# Patient Record
Sex: Male | Born: 1965 | Race: White | Hispanic: No | Smoking: Never smoker
Health system: Southern US, Community
[De-identification: ages and names within clinical notes are randomized; demographics above are authoritative.]

## PROBLEM LIST (undated history)

## (undated) DIAGNOSIS — F419 Anxiety disorder, unspecified: Secondary | ICD-10-CM

## (undated) DIAGNOSIS — J302 Other seasonal allergic rhinitis: Secondary | ICD-10-CM

## (undated) HISTORY — DX: Anxiety disorder, unspecified: F41.9

## (undated) HISTORY — DX: Other seasonal allergic rhinitis: J30.2

---
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
## 1999-04-14 HISTORY — PX: INGUINAL HERNIA REPAIR: SUR1180

## 2006-04-13 HISTORY — PX: SKIN CANCER EXCISION: SHX779

## 2007-06-21 ENCOUNTER — Ambulatory Visit: Payer: Self-pay | Admitting: Family Medicine

## 2007-06-21 DIAGNOSIS — F341 Dysthymic disorder: Secondary | ICD-10-CM | POA: Insufficient documentation

## 2007-06-21 LAB — CONVERTED CEMR LAB
AST: 30 units/L (ref 0–37)
Albumin: 4 g/dL (ref 3.5–5.2)
Basophils Absolute: 0 10*3/uL (ref 0.0–0.1)
Bilirubin, Direct: 0.1 mg/dL (ref 0.0–0.3)
Chloride: 106 meq/L (ref 96–112)
Direct LDL: 143 mg/dL
Eosinophils Absolute: 0.1 10*3/uL (ref 0.0–0.6)
Eosinophils Relative: 1.3 % (ref 0.0–5.0)
GFR calc Af Amer: 71 mL/min
GFR calc non Af Amer: 59 mL/min
Glucose, Bld: 110 mg/dL — ABNORMAL HIGH (ref 70–99)
HCT: 40.1 % (ref 39.0–52.0)
Hemoglobin: 13.6 g/dL (ref 13.0–17.0)
Lymphocytes Relative: 37.9 % (ref 12.0–46.0)
MCHC: 34 g/dL (ref 30.0–36.0)
MCV: 88.6 fL (ref 78.0–100.0)
Monocytes Absolute: 0.4 10*3/uL (ref 0.2–0.7)
Neutro Abs: 2.2 10*3/uL (ref 1.4–7.7)
Neutrophils Relative %: 51.1 % (ref 43.0–77.0)
Potassium: 4.9 meq/L (ref 3.5–5.1)
Sodium: 145 meq/L (ref 135–145)
WBC: 4.4 10*3/uL — ABNORMAL LOW (ref 4.5–10.5)

## 2007-06-24 ENCOUNTER — Telehealth: Payer: Self-pay | Admitting: Family Medicine

## 2007-06-27 ENCOUNTER — Telehealth: Payer: Self-pay | Admitting: Family Medicine

## 2007-07-05 ENCOUNTER — Telehealth: Payer: Self-pay | Admitting: Family Medicine

## 2007-07-07 ENCOUNTER — Telehealth (INDEPENDENT_AMBULATORY_CARE_PROVIDER_SITE_OTHER): Payer: Self-pay | Admitting: *Deleted

## 2008-09-03 ENCOUNTER — Encounter: Payer: Self-pay | Admitting: Family Medicine

## 2008-09-04 ENCOUNTER — Ambulatory Visit: Payer: Self-pay | Admitting: Family Medicine

## 2008-09-06 LAB — CONVERTED CEMR LAB
AST: 25 units/L (ref 0–37)
Albumin: 4.3 g/dL (ref 3.5–5.2)
Alkaline Phosphatase: 36 units/L — ABNORMAL LOW (ref 39–117)
BUN: 14 mg/dL (ref 6–23)
Basophils Absolute: 0 10*3/uL (ref 0.0–0.1)
CO2: 33 meq/L — ABNORMAL HIGH (ref 19–32)
Calcium: 9.7 mg/dL (ref 8.4–10.5)
Cholesterol: 198 mg/dL (ref 0–200)
Glucose, Bld: 95 mg/dL (ref 70–99)
HDL: 59.6 mg/dL (ref >39.00)
Hemoglobin: 14 g/dL (ref 13.0–17.0)
Lymphocytes Relative: 29.7 % (ref 12.0–46.0)
Monocytes Relative: 6.9 % (ref 3.0–12.0)
Platelets: 165 10*3/uL (ref 150.0–400.0)
RDW: 11.9 % (ref 11.5–14.6)
Sodium: 145 meq/L (ref 135–145)
TSH: 1.36 microintl units/mL (ref 0.35–5.50)
Total Protein: 7.2 g/dL (ref 6.0–8.3)
WBC: 5 10*3/uL (ref 4.5–10.5)

## 2013-11-13 ENCOUNTER — Encounter: Payer: Self-pay | Admitting: Neurology

## 2013-12-04 ENCOUNTER — Telehealth: Payer: Self-pay | Admitting: Neurology

## 2013-12-04 NOTE — Telephone Encounter (Signed)
Pt called to cancel his 12/07/13 appt. He did not give a reason. Dr. Valentina Lucks office was notified by phone.

## 2013-12-07 ENCOUNTER — Telehealth: Payer: Self-pay | Admitting: Neurology

## 2013-12-07 ENCOUNTER — Ambulatory Visit: Payer: Self-pay | Admitting: Neurology

## 2013-12-07 NOTE — Telephone Encounter (Signed)
Let Dr Valentina Lucks office know that the patient cancelled appt and did not resch

## 2014-04-10 ENCOUNTER — Ambulatory Visit: Payer: Self-pay

## 2018-02-02 DIAGNOSIS — F0390 Unspecified dementia without behavioral disturbance: Secondary | ICD-10-CM | POA: Insufficient documentation

## 2018-02-02 DIAGNOSIS — F0393 Unspecified dementia, unspecified severity, with mood disturbance: Secondary | ICD-10-CM | POA: Insufficient documentation

## 2018-02-02 DIAGNOSIS — F329 Major depressive disorder, single episode, unspecified: Principal | ICD-10-CM

## 2018-02-11 ENCOUNTER — Ambulatory Visit: Payer: BC Managed Care – PPO | Admitting: Psychiatry

## 2018-02-11 DIAGNOSIS — F321 Major depressive disorder, single episode, moderate: Secondary | ICD-10-CM

## 2018-02-11 NOTE — Progress Notes (Signed)
      [facility] Counselor/Therapist Progress Note   Patient ID: Collin Harris, MRN: [id number]  Date: 02/11/2018  Timespent: 58 minutes   Treatment Type: Individual   Reported Symptoms: anxiety   Mental Status Exam:    Appearance:   Well Groomed     Behavior:  Appropriate  Motor:  Normal  Speech/Language:   Clear and Coherent  Affect:  Appropriate  Mood:  anxious  Thought process:  normal  Thought content:    WNL  Sensory/Perceptual disturbances:    WNL  Orientation:  oriented to person, place, time/date and situation  Attention:  Good  Concentration:  Good  Memory:  WNL  Fund of knowledge:   Good  Insight:    Good  Judgment:   Good  Impulse Control:  Good     Risk Assessment: Danger to Self:  No Self-injurious Behavior: No Danger to Others: No Duty to Warn:no Physical Aggression / Violence:No  Access to Firearms a concern: No  Gang Involvement:No    Subjective: The client comes in today anxious and stressed.  He recently met a woman online that he is planning to meet in person tomorrow in [place], [place].  He has anxiety about meeting her for the first time.  He also notes that he and his ex-wife are switching residences to better take care of their 2 dogs theyhave.  The client is also stressed by his mom in [place].  She is out of rehab and into assisted living but wants to return home.  She is unable to drive and he is wrestling with how to manage that with her.  He also is stressed out with his work at the [employer]  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  In February he takes the boards to be certified in obesity medicine. I started with eye movement today around the clients level of stress about these different issues.  He was able to reduce his subjective units of distress from a suds equals 7+ to less than 1 at the end of the session.  We also did an exercise with mindfulness and breath work.  He was able  to get to a very calm and peaceful place inside of himself and then we integrated the word fluid as his cue word to that.  The client felt significantly better at the end of the session.  He will use the cue word during the course of the day to bring back to the feelings of relaxation.   Interventions: Mindfulness Meditation, Solution-Oriented/Positive Psychology, [facility] Desensitization and Reprocessing (EMDR) and Insight-Oriented   Diagnosis:   ICD-10-CM   1. Major depressive disorder, single episode, moderate (HCC) F32.1      Plan: Use mindfulness, relaxation skills, positive self talk.   Albertina Parr [place], [place]

## 2018-03-25 ENCOUNTER — Encounter: Payer: Self-pay | Admitting: Psychiatry

## 2018-03-25 ENCOUNTER — Ambulatory Visit: Payer: BC Managed Care – PPO | Admitting: Psychiatry

## 2018-03-25 DIAGNOSIS — F321 Major depressive disorder, single episode, moderate: Secondary | ICD-10-CM | POA: Diagnosis not present

## 2018-03-25 NOTE — Progress Notes (Signed)
      [facility] Counselor/Therapist Progress Note  Patient ID: Collin Harris, MRN: [id number],    Date: 03/25/2018  Time Spent: 57 minutes   Treatment Type: Individual Therapy  Reported Symptoms: Anxious Mood  Mental Status Exam:  Appearance:   Well Groomed     Behavior:  Appropriate  Motor:  Normal  Speech/Language:   Clear and Coherent  Affect:  Appropriate  Mood:  anxious and sad  Thought process:  normal  Thought content:    WNL  Sensory/Perceptual disturbances:    WNL  Orientation:  oriented to person, place, time/date and situation  Attention:  Good  Concentration:  Good  Memory:  WNL  Fund of knowledge:   Good  Insight:    Good  Judgment:   Good  Impulse Control:  Good   Risk Assessment: Danger to Self:  No Self-injurious Behavior: No Danger to Others: No Duty to Warn:no Physical Aggression / Violence:No  Access to Firearms a concern: No  Gang Involvement:No   Subjective: The client states that he is still dating a woman he met on [employer] who lives in [place], [place].  He states, "we communicate a lot but she seems a little damaged."  In the big picture the client is enjoying the relationship because they communicate well.  He states, "she pays attention to me and I like how we connect." We discussed that the client should not over analyze the circumstance.  He agrees he needs to walk it out with her and see how things go.  He does have some anxiety because of the uncertainty of where things are going.  I discussed with the client that it is still so early in the relationship he needs to relax.  He agrees.  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  He states it will be a jump in his salary which he needs.  He still continues to study for the boards in February on obesity medicine.  The client and his ex-wife have switched houses.  He lives in her townhome and she lives in the house.   They both continue to pay for their respective houses.  They just live in different places.  This seems to be working out well for both of them. The client continues to exercise increase his social network and pay attention to his self-care.  He continues to be cognizant that he needs to be appropriately assertive in the relationship and ask for what he wants.   Interventions: Assertiveness/Communication, Solution-Oriented/Positive Psychology, Eye Movement Desensitization and Reprocessing (EMDR) and Insight-Oriented  Diagnosis:   ICD-10-CM   1. Major depressive disorder, single episode, moderate (HCC) F32.1     Plan: Assertiveness, exercise, self-care.  Albertina Parr Leeandre Nordling, [place]

## 2018-05-13 ENCOUNTER — Encounter: Payer: Self-pay | Admitting: Psychiatry

## 2018-05-13 ENCOUNTER — Ambulatory Visit: Payer: BC Managed Care – PPO | Admitting: Psychiatry

## 2018-05-13 DIAGNOSIS — F321 Major depressive disorder, single episode, moderate: Secondary | ICD-10-CM

## 2018-05-13 NOTE — Progress Notes (Signed)
      [facility] Counselor/Therapist Progress Note  Patient ID: Collin Harris, MRN: [id number],    Date: 05/13/2018  Time Spent: 58 minutes   Treatment Type: Individual Therapy  Reported Symptoms: Anxious Mood  Mental Status Exam:  Appearance:   Casual and Well Groomed     Behavior:  Appropriate  Motor:  Normal  Speech/Language:   Clear and Coherent  Affect:  Appropriate  Mood:  anxious  Thought process:  normal  Thought content:    WNL  Sensory/Perceptual disturbances:    WNL  Orientation:  oriented to person, place, time/date and situation  Attention:  Good  Concentration:  Good  Memory:  WNL  Fund of knowledge:   Good  Insight:    Good  Judgment:   Good  Impulse Control:  Good   Risk Assessment: Danger to Self:  No Self-injurious Behavior: No Danger to Others: No Duty to Warn:no Physical Aggression / Violence:No  Access to Firearms a concern: No  Gang Involvement:No   Subjective: The client came in with 2 issues today.  1) relationship.  2) work.  The client states that they are losing to psychiatric providers at the [facility].  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  He is still excessively worried about being sued.  He is also studying for the obesity boards on February 24. The client is still in a relationship with a woman in [place].  Today I used eye movement with the client focusing on the girlfriend.  His negative belief was, "I am uncertain."  He feels chaos in his chest.  As the client processed he had a lot of uncertainty about if this relationship was really working or not.  He states they connect well sexually and when they are together the communication is good.  The fact that there are 3-1/2 hours apart is complicated.  He plans to go up there this weekend and rent an air B&B.  He will ask her how she feels the relationship is going and being direct about what he needs or want.   At the end of the session the clients anxiety had gone from a subjective units of distress of 7 to less than 2.  He will be proactive and plan on taking things 1 day at a time.  Interventions: Assertiveness/Communication, Solution-Oriented/Positive Psychology, Eye Movement Desensitization and Reprocessing (EMDR) and Insight-Oriented  Diagnosis:   ICD-10-CM   1. Major depressive disorder, single episode, moderate (HCC) F32.1     Plan: Assertiveness, boundaries, positive self talk.  Collin Harris, [place]

## 2018-07-08 ENCOUNTER — Ambulatory Visit: Payer: BC Managed Care – PPO | Admitting: Psychiatry

## 2018-08-05 ENCOUNTER — Encounter: Payer: Self-pay | Admitting: Psychiatry

## 2018-08-05 ENCOUNTER — Other Ambulatory Visit: Payer: Self-pay

## 2018-08-05 ENCOUNTER — Ambulatory Visit: Payer: BC Managed Care – PPO | Admitting: Psychiatry

## 2018-08-05 DIAGNOSIS — F321 Major depressive disorder, single episode, moderate: Secondary | ICD-10-CM | POA: Diagnosis not present

## 2018-08-05 NOTE — Progress Notes (Signed)
      [facility] Counselor/Therapist Progress Note  Patient ID: Collin Harris, MRN: [id number],    Date: 08/05/2018  Time Spent: 56 minutes   Treatment Type: Individual Therapy  Reported Symptoms: anxiety, depressed mood  Mental Status Exam:  Appearance:   Casual and Well Groomed     Behavior:  Appropriate  Motor:  Normal  Speech/Language:   Clear and Coherent  Affect:  Appropriate  Mood:  anxious, depressed and sad  Thought process:  normal  Thought content:    WNL  Sensory/Perceptual disturbances:    WNL  Orientation:  oriented to person, place, time/date and situation  Attention:  Good  Concentration:  Good  Memory:  WNL  Fund of knowledge:   Good  Insight:    Good  Judgment:   Good  Impulse Control:  Good   Risk Assessment: Danger to Self:  No Self-injurious Behavior: No Danger to Others: No Duty to Warn:no Physical Aggression / Violence:No  Access to Firearms a concern: No  Gang Involvement:No   Subjective: I connected with patient by a video enabled telemedicine application or telephone, with their informed consent, and verified patient privacy and that I am speaking with the correct person using two identifiers.  I was located at [facility] and patient at home. The client reports that he has been sheltering in place at home.  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  He states he is required to go into the office every third day.  He is unsure when things will return to a normal schedule.  There are no students on campus so things are very slow. His dogs which have been his main companions are staying at the house most of the time. He still owns the house with his ex-wife.  He is staying at her townhome for the time being.  Recently 1 of the dogs got very sick and had to go to the vet emergency room.  His relationship with his girlfriend in [place] has been more  difficult since they can only communicate by text or phone. The client overall is melancholy about his current circumstances.  He is so lonely since he has no partner here.  We discussed things that he could do to improve his mood.  He states he does get out every other day to run which helps.  When he has the dogs he does take them each for 2-hour walk.  He is concerned about the virus because he has underlying health issues.  As the client discussed these issues he became calmer and more focused about what he can do.  He agrees to focus on his sunlight exposure, exercise, engaged activities, engaging his social network, sleep hygiene, and omega-3 fatty acids.   Interventions: Assertiveness/Communication, Solution-Oriented/Positive Psychology and Insight-Oriented  Diagnosis:   ICD-10-CM   1. Major depressive disorder, single episode, moderate (HCC) F32.1     Plan: Review handout, 6 steps to beating depression.  Assertive communication, boundaries.  This record has been created using [employer].  Chart creation errors have been sought, but Keric Zehren not always have been located and corrected. Such creation errors do not reflect on the standard of medical care.  Gelene Mink Mattea Seger, [facility]

## 2018-09-16 ENCOUNTER — Other Ambulatory Visit: Payer: Self-pay

## 2018-09-16 ENCOUNTER — Encounter: Payer: Self-pay | Admitting: Psychiatry

## 2018-09-16 ENCOUNTER — Ambulatory Visit: Payer: BC Managed Care – PPO | Admitting: Psychiatry

## 2018-09-16 DIAGNOSIS — F321 Major depressive disorder, single episode, moderate: Secondary | ICD-10-CM

## 2018-09-16 NOTE — Progress Notes (Signed)
[facility] Counselor/Therapist Progress Note  Patient ID: Collin Harris, MRN: [id number],    Date: 09/16/2018  Time Spent: 55 minutes   Treatment Type: Individual Therapy  Reported Symptoms: melancholy, sad  Mental Status Exam:  Appearance:   Casual and Well Groomed     Behavior:  Appropriate  Motor:  Normal  Speech/Language:   Clear and Coherent  Affect:  Appropriate  Mood:  depressed and sad  Thought process:  normal  Thought content:    WNL  Sensory/Perceptual disturbances:    WNL  Orientation:  oriented to person, place, time/date and situation  Attention:  Good  Concentration:  Good  Memory:  WNL  Fund of knowledge:   Good  Insight:    Good  Judgment:   Good  Impulse Control:  Good   Risk Assessment: Danger to Self:  No Self-injurious Behavior: No Danger to Others: No Duty to Warn:no Physical Aggression / Violence:No  Access to Firearms a concern: No  Gang Involvement:No   Subjective: Telehealth visit (video) I connected with patient by a video enabled telemedicine/telehealth application or telephone, with his informed consent, and verified patient privacy and that I am speaking with the correct person using two identifiers.  I was located at my office and patient at his home.  We discussed the limitations, risks, and security and privacy concerns associated with telehealth services and the availability of in-person appointments, including awareness that he Collin Harris be responsible for charges related to the service, and he expressed understanding and agreed to proceed.  I discussed treatment planning with him, with opportunity to ask and answer all questions. Agreed with the plan, demonstrated an understanding of the instructions, and made him aware to call our office if symptoms worsen or he feels he is in a crisis state and needs immediate contact.  The client is having a difficult time with the ongoing shelter in place and curfew that has occurred.  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  This is not his first choice.  He would like to be able to get out of direct patient care altogether.  He finds it too stressful and fears the liability.  He recently passed his boards certification for obesity medicine.  Nationally he scored 96%. Any options that he has to do obesity medicine are currently on hold due to the uncertainty with medical system.  Client has also not been able to see his girlfriend since March 6.  She lives in [place] and they have not been able to travel back and forth.  "I just do not know what to do."  The client has been studying Buddhism more diligently.  He is working towards practicing mindfulness at least 20 to 30 minutes/day.  We discussed different mindfulness practices such as controlled breathing and being mindful while running or eating.  The client has been practicing these which has helped. The client continues to work on radical acceptance, practicing mood independent behavior and being hopeful that things will calm down not only locally but nationally. In mid July the client plans on driving to [place] to visit his mother.  He is unsure of where things will be with the quarantine that is been happening in the nursing facilities.  Interventions: Assertiveness/Communication, Mindfulness Meditation, Motivational Interviewing, Solution-Oriented/Positive Psychology and Insight-Oriented  Diagnosis:   ICD-10-CM   1. Major depressive disorder, single episode, moderate (HCC) F32.1  Plan: Mindfulness, mood independent behavior, exercise, sun exposure, assertiveness.  This record has been created using [employer] software.  Chart creation errors have been sought, but Collin Harris not always have been located and corrected. Such creation errors do not reflect on the standard of medical care.  Collin Harris, [facility]

## 2018-10-21 ENCOUNTER — Other Ambulatory Visit: Payer: Self-pay

## 2018-10-21 ENCOUNTER — Encounter: Payer: Self-pay | Admitting: Psychiatry

## 2018-10-21 ENCOUNTER — Ambulatory Visit: Payer: BC Managed Care – PPO | Admitting: Psychiatry

## 2018-10-21 DIAGNOSIS — F321 Major depressive disorder, single episode, moderate: Secondary | ICD-10-CM
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
## 2019-02-17 ENCOUNTER — Encounter: Payer: Self-pay | Admitting: Psychiatry

## 2019-02-17 ENCOUNTER — Ambulatory Visit (INDEPENDENT_AMBULATORY_CARE_PROVIDER_SITE_OTHER): Payer: BC Managed Care – PPO | Admitting: Psychiatry

## 2019-02-17 ENCOUNTER — Other Ambulatory Visit: Payer: Self-pay

## 2019-02-17 DIAGNOSIS — F321 Major depressive disorder, single episode, moderate: Secondary | ICD-10-CM

## 2019-02-17 NOTE — Progress Notes (Signed)
      [facility] Counselor/Therapist Progress Note  Patient ID: MANASSEH PITTSLEY, MRN: [id number],    Date: 02/17/2019  Time Spent: 57 minutes  Treatment Type: Individual Therapy  Reported Symptoms: lack of motivation, lonely, inertia, fatigue  Mental Status Exam:  Appearance:   Meticulous and Well Groomed     Behavior:  Appropriate  Motor:  Normal  Speech/Language:   Clear and Coherent  Affect:  Appropriate  Mood:  depressed and lack of motivation, fatigue.  Thought process:  normal  Thought content:    WNL  Sensory/Perceptual disturbances:    WNL  Orientation:  oriented to person, place, time/date and situation  Attention:  Good  Concentration:  Good  Memory:  WNL  Fund of knowledge:   Good  Insight:    Good  Judgment:   Good  Impulse Control:  Good   Risk Assessment: Danger to Self:  No Self-injurious Behavior: No Danger to Others: No Duty to Warn:no Physical Aggression / Violence:No  Access to Firearms a concern: No  Gang Involvement:No   Subjective: The client states that his relationship ended in March.  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  He states that it is gone okay for him.  Their census is down at work which has made things easier. Today we used eye-movement to focus on the client's loneliness and disappointment with his life.  His negative cognition is, "I am frustrated."  He feels frustration and a lack of motivation.  His subjective units of distress is 5.  As the client discussed this he stated, "I am tired emotionally."  He has been on one dating app but has not been motivated to pursue anything.  He states he gets about 9 hours of sleep per night.  He is currently on no psychiatric medication.  He does note that he has been taking fish oil and felt that it has stabilized his mood.  We discussed using a supplement SAMe since he has such negative side effects with the SSRIs.  The client states he  will consider this.  We also discussed the necessity of being more proactive.  He agreed that this would be necessary.  "I need to make a schedule."  The client will look for activities that he can do that offer respite and refreshment for him.  This includes kayaking, running and playing his guitar.  He has been practicing with patient only and finds that that has been helpful-getting back to sleep at night.  He also realizes that he needs more socialization and will look at other dating apps such as Bumble and Hinge to expand his network.  At the end of the session the client subjective units of distress was 1+.  His positive cognition was, "I can do this."  Interventions: Assertiveness/Communication, Mindfulness Meditation, Motivational Interviewing, Solution-Oriented/Positive Psychology, [facility] Group Desensitization and Reprocessing (EMDR) and Insight-Oriented  Diagnosis:   ICD-10-CM   1. Major depressive disorder, single episode, moderate (HCC)  F32.1     Plan: meditation, social network, proactive, schedule, exercise, dating apps.   Orie Cuttino, [facility]

## 2019-04-11 ENCOUNTER — Other Ambulatory Visit: Payer: Self-pay

## 2019-04-11 ENCOUNTER — Encounter: Payer: Self-pay | Admitting: Sports Medicine

## 2019-04-11 ENCOUNTER — Ambulatory Visit: Payer: BC Managed Care – PPO | Admitting: Sports Medicine

## 2019-04-11 ENCOUNTER — Ambulatory Visit
Admission: RE | Admit: 2019-04-11 | Discharge: 2019-04-11 | Disposition: A | Discharge status: Home / Self Care | Payer: BC Managed Care – PPO | Source: Ambulatory Visit | Attending: Family Medicine | Admitting: Family Medicine

## 2019-04-11 ENCOUNTER — Encounter: Payer: Self-pay | Admitting: Family Medicine

## 2019-04-11 VITALS — BP 102/68 | Ht 65.0 in | Wt 126.0 lb

## 2019-04-11 DIAGNOSIS — S62629A Displaced fracture of medial phalanx of unspecified finger, initial encounter for closed fracture: Secondary | ICD-10-CM

## 2019-04-11 DIAGNOSIS — M79645 Pain in left finger(s): Secondary | ICD-10-CM | POA: Diagnosis not present

## 2019-04-11 NOTE — [place] Notes (Signed)
PCP: Kirby Funk, MD  Subjective:   HPI: Patient is a 53 y.o. male here for evaluation of left third finger pain.  Patient is an avid runner and fell while running on Christmas Day.  He follows on an outstretched hand.  Patient notes after the fall he had pain in his third finger on his left hand and the PIP joint.  He is concerned about a possible volar plate injury.  Patient notes he has been unable to fully flex his finger since the injury.  He does have some bruising in that area as well as mild amount of swelling.  He has no numbness or tingling in his fingers.  The pain does not radiate.  He notes he also had pain in several of the fingers after the fall however these are improving.  Patient works as a Development worker, community at [employer].  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   Review of Systems: See HPI above.  Past Medical History:  Diagnosis Date  . Anxiety   . Seasonal allergies     Current Outpatient Medications on File Prior to Visit  Medication Sig Dispense Refill  . cholecalciferol (VITAMIN D) 1000 UNITS tablet Take 1,000 Units by mouth daily.    Marland Kitchen lisinopril (ZESTRIL) 5 MG tablet     . Omega-3 Fatty Acids (FISH OIL PO) Take 1,000 mg by mouth daily.    Marland Kitchen triamcinolone (NASACORT ALLERGY 24HR) 55 MCG/ACT AERO nasal inhaler Place 2 sprays into the nose daily.     No current facility-administered medications on file prior to visit.    Past Surgical History:  Procedure Laterality Date  . INGUINAL HERNIA REPAIR Right 2001  . SKIN CANCER EXCISION  2008   Mohs basal cell carcinoma neck  . TONSILLECTOMY  1968    Allergies  Allergen Reactions  . Codeine     REACTION: nausea    Social History   Socioeconomic History  . Marital status: Married    Spouse name: Not on file  . Number of children: Not on file  . Years of education: Not on file  . Highest education level: Not on file  Occupational History  . Not on file   Tobacco Use  . Smoking status: Never Smoker  . Smokeless tobacco: Never Used  Substance and Sexual Activity  . Alcohol use: Yes    Alcohol/week: 2.0 standard drinks    Types: 2 Standard drinks or equivalent per week  . Drug use: No  . Sexual activity: Yes    Partners: Female  Other Topics Concern  . Not on file  Social History Narrative  . Not on file   Social Determinants of Health   Financial Resource Strain:   . Difficulty of Paying Living Expenses: Not on file  Food Insecurity:   . Worried About Programme researcher, broadcasting/film/video in the Last Year: Not on file  . Ran Out of Food in the Last Year: Not on file  Transportation Needs:   . Lack of Transportation (Medical): Not on file  . Lack of Transportation (Non-Medical): Not on file  Physical Activity:   . Days of Exercise per Week: Not on file  . Minutes of Exercise per Session: Not on file  Stress:   . Feeling of Stress : Not on file  Social Connections:   . Frequency of Communication with Friends and Family: Not on file  . Frequency of Social Gatherings with Friends and  Family: Not on file  . Attends Religious Services: Not on file  . Active Member of Clubs or Organizations: Not on file  . Attends Archivist Meetings: Not on file  . Marital Status: Not on file  Intimate Partner Violence:   . Fear of Current or Ex-Partner: Not on file  . Emotionally Abused: Not on file  . Physically Abused: Not on file  . Sexually Abused: Not on file    History reviewed. No pertinent family history.      Objective:  Physical Exam: BP 102/68   Ht 5\' 5"  (1.651 m)   Wt 126 lb (57.2 kg)   BMI 20.97 kg/m  Gen: NAD, comfortable in exam room Lungs: Breathing comfortably on room air Hand/wrist Exam Left -Inspection: Mild bruising and abrasions noted on the volar aspect of his third and fourth digits -Palpation: Tenderness palpation over the volar aspect of his third PIP -ROM: Slight loss of flexion at the PIP joint of his third  left finger.  The remainder of his fingers his left hand had normal range of motion with flexion extension -Strength: Flexion: 5/5; Extension: 5/5; Radial Deviation: 5/5; Ulnar Deviation: 5/5 -Limb neurovascularly intact  Contralateral Hand/wrist -Inspection: No deformity, no discoloration -Palpation: No tenderness palpation -ROM: Normal range of motion in all fingers -Strength: Flexion: 5/5; Extension: 5/5; Radial Deviation: 5/5; Ulnar Deviation: 5/5 -Limb neurovascularly intact  Limited diagnostic ultrasound of the left third finger Findings: -Normal appearance of the third metacarpal -Normal appearance of the MCP joint -Normal appearance of the deep and superficial flexor tendons -Small avulsion fracture is noted on the volar aspect of the middle phalanx at the PIP joint.  Surrounding fluid noticed in this area as well -Small cortical irregularity noted on the proximal aspect of the third middle phalanx impression: -Ultrasound findings consistent with avulsion fracture at the PIP joint of the third finger   Assessment & Plan:  Patient is a 53 y.o. male here for evaluation of left third finger pain  1.  Avulsion fracture at the left third PIP -Ultrasound findings consistent with a very small avulsion fracture -X-rays were ordered for further evaluation -The avulsion fracture is extremely small patient was advised he can buddy tape his fingers as needed for comfort -Patient should avoid playing guitar for the next 2 weeks -Patient is advised total healing time will take between 4 and 6 weeks   I will call patient with results of the x-ray.  Patient will follow up on an as-needed basis after this

## 2019-04-21 ENCOUNTER — Ambulatory Visit: Payer: BC Managed Care – PPO | Admitting: Psychiatry

## 2019-05-26 ENCOUNTER — Encounter: Payer: Self-pay | Admitting: Psychiatry

## 2019-05-26 ENCOUNTER — Other Ambulatory Visit: Payer: Self-pay

## 2019-05-26 ENCOUNTER — Ambulatory Visit (INDEPENDENT_AMBULATORY_CARE_PROVIDER_SITE_OTHER): Payer: BC Managed Care – PPO | Admitting: Psychiatry

## 2019-05-26 DIAGNOSIS — F321 Major depressive disorder, single episode, moderate: Secondary | ICD-10-CM
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
## 2019-06-30 ENCOUNTER — Ambulatory Visit (INDEPENDENT_AMBULATORY_CARE_PROVIDER_SITE_OTHER): Payer: BC Managed Care – PPO | Admitting: Psychiatry

## 2019-06-30 ENCOUNTER — Encounter: Payer: Self-pay | Admitting: Psychiatry

## 2019-06-30 ENCOUNTER — Other Ambulatory Visit: Payer: Self-pay

## 2019-06-30 DIAGNOSIS — F341 Dysthymic disorder: Secondary | ICD-10-CM | POA: Diagnosis not present

## 2019-06-30 NOTE — [place] Notes (Signed)
      [facility] Counselor/Therapist [place] Note  Patient ID: Collin Harris, MRN: [id number],    Date: 06/30/2019  Time Spent: 56 minutes   Treatment Type: Individual Therapy  Reported Symptoms: depressed  Mental Status Exam:  Appearance:   Well Groomed     Behavior:  Appropriate  Motor:  Normal  Speech/Language:   Clear and Coherent  Affect:  Flat  Mood:  dysthymic  Thought process:  normal  Thought content:    WNL  Sensory/Perceptual disturbances:    WNL  Orientation:  oriented to person, place, time/date and situation  Attention:  Good  Concentration:  Good  Memory:  WNL  Fund of knowledge:   Good  Insight:    Good  Judgment:   Good  Impulse Control:  Good   Risk Assessment: Danger to Self:  No Self-injurious Behavior: No Danger to Others: No Duty to Warn:no Physical Aggression / Violence:No  Access to Firearms a concern: No  Gang Involvement:No   Subjective: The client is working on call with the Collin Harris.  He states that it always weighs him down to spend his weekend waiting for phone calls that Collin Harris never come.  He gets discouraged that things have not worked out for him.  He had been dating a woman in [place], [place] that told him they had no chemistry.  "We still talk but nothing more". He is sad over how his life is going. Today I used eye movent around the clients sadness with his life.  His negative cognition is, "I am sick of work."  His subjective units of distress is a 7.  He feels sadness in his chest.  As the client processed, he felt he was getting nowhere in terms of having a relationship.  He notes that it seems to be easy for others.  He is afraid since he also has no children that he will be alone for the rest of his life.  I discussed with the client the difference between being alone and being lonely.  He states , "feeling sorry for myself for some reason feels very comforting".  I discussed with the client to try to engage in  activities that he enjoys.  I encouraged him to learn how to appreciate his own company while doing those things.  It Collin Harris give him the opportunity to meet someone who actually is doing the same thing.  The client will consider this.  Tomorrow is his birthday I encouraged him to do something for himself to celebrate that.  He agreed.  The client continues to practice mindfulness.   Interventions: Assertiveness/Communication, Mindfulness Meditation, Motivational Interviewing, Eye Movement Desensitization and Reprocessing (EMDR) and Insight-Oriented  Diagnosis:   ICD-10-CM   1. Dysthymia  F34.1     Plan: Mindfulness, mood independent behavior, exercise, self-care, positive self talk, assertiveness, boundaries.  Collin Harris, [facility]

## 2019-07-28 ENCOUNTER — Encounter: Payer: Self-pay | Admitting: Psychiatry

## 2019-07-28 ENCOUNTER — Other Ambulatory Visit: Payer: Self-pay

## 2019-07-28 ENCOUNTER — Ambulatory Visit (INDEPENDENT_AMBULATORY_CARE_PROVIDER_SITE_OTHER): Payer: BC Managed Care – PPO | Admitting: Psychiatry

## 2019-07-28 DIAGNOSIS — F341 Dysthymic disorder: Secondary | ICD-10-CM | POA: Diagnosis not present
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
## 2019-08-17 ENCOUNTER — Other Ambulatory Visit: Payer: Self-pay | Admitting: Internal Medicine

## 2019-08-17 ENCOUNTER — Other Ambulatory Visit: Payer: Self-pay | Admitting: Nephrology

## 2019-08-17 DIAGNOSIS — N1831 Chronic kidney disease, stage 3a: Secondary | ICD-10-CM

## 2019-08-25 ENCOUNTER — Other Ambulatory Visit: Payer: Self-pay

## 2019-08-25 ENCOUNTER — Ambulatory Visit (INDEPENDENT_AMBULATORY_CARE_PROVIDER_SITE_OTHER): Payer: BC Managed Care – PPO | Admitting: Psychiatry

## 2019-08-25 ENCOUNTER — Encounter: Payer: Self-pay | Admitting: Psychiatry

## 2019-08-25 DIAGNOSIS — F341 Dysthymic disorder: Secondary | ICD-10-CM

## 2019-08-25 NOTE — [place] Notes (Signed)
      [facility] Counselor/Therapist [place] Note  Patient ID: Collin Harris, MRN: [id number],    Date: 08/25/2019  Time Spent: 56 minutes   Treatment Type: Individual Therapy  Reported Symptoms: dysthymic, worry, irritable  Mental Status Exam:  Appearance:   Well Groomed     Behavior:  Appropriate  Motor:  Normal  Speech/Language:   Clear and Coherent  Affect:  Appropriate  Mood:  anxious, dysthymic and irritable  Thought process:  normal  Thought content:    WNL  Sensory/Perceptual disturbances:    WNL  Orientation:  oriented to person, place, time/date and situation  Attention:  Good  Concentration:  Good  Memory:  WNL  Fund of knowledge:   Good  Insight:    Good  Judgment:   Good  Impulse Control:  Good   Risk Assessment: Danger to Self:  No Self-injurious Behavior: No Danger to Others: No Duty to Warn:no Physical Aggression / Violence:No  Access to Firearms a concern: No  Gang Involvement:No   Subjective: The client has tried to meet more people online.  "I am trying to increase my output."  The client continues feeling dysthymic.  He feels like his life is on hold until he can find a partner.  This past weekend was fairly social for him.  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  This is a big step forward for the client.  He is also very worried about health issues with his kidneys, blood pressure and shoulder.  He did meet with a nephrologist who assured him that what he had was very mild.  He does have plans for an ultrasound on his kidney this next week.  He states he is a little worried about that.  He finds that his blood pressure is higher at work 165/90 while at home it is 120/80.  He thinks that is also affecting his kidney function. He has tried to do some meditation to help decrease his overall stress.  Today he finds himself more annoyed at his life and unmotivated.  He is on call with his work through the weekend which has not helped him. I  discussed with the client focusing on his internal thoughts trying to keep them on the positive side.  The client was able to identify some aspects of his personality that are positive.  He knows he is a loyal and invested person. He will focus on these aspects to keep his thinking on track.  Interventions: Mindfulness Meditation, Motivational Interviewing, Solution-Oriented/Positive Psychology, [employer] Desensitization and Reprocessing (EMDR) and Insight-Oriented  Diagnosis:   ICD-10-CM   1. Dysthymia  F34.1     Plan: Positive self talk, self-care, mindfulness, meditation, exercise, boundaries.  Gelene Mink Daniell Paradise, [facility]

## 2019-08-29 ENCOUNTER — Ambulatory Visit
Admission: RE | Admit: 2019-08-29 | Discharge: 2019-08-29 | Disposition: A | Discharge status: Home / Self Care | Payer: BC Managed Care – PPO | Source: Ambulatory Visit | Attending: Internal Medicine | Admitting: Internal Medicine

## 2019-08-29 DIAGNOSIS — N1831 Chronic kidney disease, stage 3a: Secondary | ICD-10-CM

## 2019-08-31 ENCOUNTER — Ambulatory Visit: Payer: BC Managed Care – PPO | Admitting: Pediatrics

## 2019-08-31 ENCOUNTER — Other Ambulatory Visit: Payer: BC Managed Care – PPO

## 2019-08-31 ENCOUNTER — Other Ambulatory Visit: Payer: Self-pay

## 2019-08-31 VITALS — BP 121/71 | Ht 65.0 in | Wt 127.0 lb

## 2019-08-31 DIAGNOSIS — M25519 Pain in unspecified shoulder: Secondary | ICD-10-CM

## 2019-08-31 NOTE — [place] Notes (Signed)
Collin Harris - 54 y.o. male MRN [id number]  Date of birth: 1965/09/02  SUBJECTIVE:   CC: left shoulder pain  54 year old male presenting with left shoulder pain for the past 4 weeks.  He denies any acute injury or inciting event.  He reports pain over lateral shoulder with putting arm behind him, such as putting on a seatbelt or putting on his coat.  He does lift his 3 legged dog often and thinks that this possibly lead to shoulder pain.  He cannot take NSAIDs due to kidney issues.  He has not tried any medications or intervention at this time.  No numbness or tingling down the arm, no pain in the neck.    ROS: as above  PMHx - Updated and reviewed.  Contributory factors include: Negative PSHx - Updated and reviewed.  Contributory factors include:  Negative FHx - Updated and reviewed.  Contributory factors include:  Negative Social Hx - Updated and reviewed. Contributory factors include: Negative Medications - reviewed   DATA REVIEWED: none  PHYSICAL EXAM:  VS: BP:121/71  HR: bpm  TEMP: ( )  RESP:   HT:5\' 5"  (165.1 cm)   WT:127 lb (57.6 kg)  BMI:21.13 PHYSICAL EXAM: Gen: NAD, alert, cooperative with exam, well-appearing HEENT: clear conjunctiva,  CV:  no edema, capillary refill brisk, normal rate Resp: non-labored Skin: no rashes, normal turgor  Neuro: no gross deficits.  Psych:  alert and oriented  Left Shoulder: Inspection reveals no obvious deformity, atrophy, or asymmetry. No bruising. No swelling Palpation is normal with no TTP over [facility] joint or bicipital groove. Pain at end point of full flexion and abduction. IR to mid back, full ER. NV intact distally Normal scapular function observed. Special Tests:  - Impingement: Positive Hawkins, neers, empty can sign. - Supraspinatous: Positive empty can.  5/5 strength with resisted flexion at 20 degrees- mild pain - Infraspinatous/Teres Minor: 5/5 strength with ER - Subscapularis: negative belly press, negative bear hug.  5/5 strength with IR - Biceps tendon: Negative Speeds, Yerrgason's  - Labrum: Negative Obriens - AC Joint: Negative cross arm - Negative apprehension test - No painful arc L scapular more protracted than right, less mobile Scoliosis noted   ULTRASOUND: Shoulder, left  Diagnostic limited ultrasound imaging obtained of patient's left shoulder.  - No obvious evidence of bony deformity or osteophyte development appreciated.  - Long head of the biceps tendon: No evidence of tendon thickening, calcification, subluxation, or tearing in short or long axis views. No edema or bullseye sign.  - Subscapularis tendon: complete visualization across the width of the insertion point yielded no evidence of tendon thickening, calcification, or tears in the long axis view.  - Supraspinatus tendon: complete visualization across the width of the insertion point yielded partial thickness tear at leading edge. No evidence of bursal inflammation appreciated.  - Infraspinatus and teres minor tendons: visualization across the width of the insertion points yielded no evidence of tendon thickening, calcification, or tears in the long axis view.  [facility] Joint: No evidence of joint separation, collapse, or osteophyte development appreciated. No effusion present.   IMPRESSION: findings consistent with partial supraspinatus tear   ASSESSMENT & PLAN:  Left shoulder pain- appears consistent with impingement secondary to partial supraspinatus tear and likely some scapular dyskinesia as well.  He has good strength at this time.  We discussed various treatment options, including nitroglycerin patches, corticosteroid injection for pain relief in addition to rotator cuff rehab exercises.  He cannot take oral NSAIDs but  discussed that he can try topical Voltaren.  At this time, he would like to work on rotator cuff and shoulder scapular stabilization exercises. He will return in 4-6 weeks if no improvement and we will likely obtain  MRI at that time.  I was the preceptor for this visit and available for immediate consultation Shellia Cleverly, DO

## 2019-09-22 ENCOUNTER — Ambulatory Visit: Payer: BC Managed Care – PPO | Admitting: Psychiatry

## 2019-10-27 ENCOUNTER — Ambulatory Visit: Payer: BC Managed Care – PPO | Admitting: Psychiatry

## 2019-11-17 ENCOUNTER — Encounter: Payer: Self-pay | Admitting: Psychiatry

## 2019-11-17 ENCOUNTER — Other Ambulatory Visit: Payer: Self-pay

## 2019-11-17 ENCOUNTER — Ambulatory Visit (INDEPENDENT_AMBULATORY_CARE_PROVIDER_SITE_OTHER): Payer: BC Managed Care – PPO | Admitting: Psychiatry

## 2019-11-17 DIAGNOSIS — F411 Generalized anxiety disorder: Secondary | ICD-10-CM | POA: Diagnosis not present

## 2019-11-17 NOTE — Progress Notes (Signed)
[facility] Counselor/Therapist Progress Note  Patient ID: Collin Harris, MRN: [id number],    Date: 11/17/2019  Time Spent: 56 minutes   Treatment Type: Individual Therapy  Reported Symptoms: anxiety, rumination  Mental Status Exam:  Appearance:   Well Groomed     Behavior:  Appropriate  Motor:  Normal  Speech/Language:   Clear and Coherent  Affect:  Appropriate  Mood:  anxious  Thought process:  normal  Thought content:    Rumination  Sensory/Perceptual disturbances:    WNL  Orientation:  oriented to person, place, time/date and situation  Attention:  Good  Concentration:  Good  Memory:  WNL  Fund of knowledge:   Good  Insight:    Good  Judgment:   Good  Impulse Control:  Good   Risk Assessment: Danger to Self:  No Self-injurious Behavior: No Danger to Others: No Duty to Warn:no Physical Aggression / Violence:No  Access to Firearms a concern: No  Gang Involvement:No   Subjective: The client states his summer has been weird with healthcare anxiety.  He met with the nephrologist who did an ultrasound of his kidneys.  He found that one is smaller than the other.  The nephrologist told him that he was at low risk for end-stage renal disease.  He is also been worried because of his cholesterol.  His doctor recommended a statin because of his kidney functioning.  The statin had a negative effect on the client increasing his fatigue and muscle tremors in his legs.  The client was then worried that he was developing ALS or multiple sclerosis.  As the client spoke of it out loud he realized it was ridiculous.  His other anxiety related to his current girlfriend that he has been dating about 6 weeks.  He describes her as "quirky".  She has not let him come inside of her house.  He suspects that she Bedelia Pong be disorganized and her house is cluttered or that she might be a Ship broker.  Their relationship seems to go okay but she only allows him to kiss her and no further.  The client  feels he is okay with this but would like things to progress further along.  I used eye-movement with the client focusing on these issues.  His negative cognition is, "things feel out of control."  His subjective units of distress is a 6+.  He feels anxiety in his chest.  As the client processed his subjective units of distress began to drop.  He realized that he gets into a catastrophic thought process.  We discussed ways of taking control of this such as using his mindfulness skills.  The client agreed and stated he would try to do more meditation.  "I need to take it one step at a time."  The client also agreed that he needed to practice more radical acceptance not only with his health care and aging body but in his relationship as well.  As the client continued to process his subjective units of distress was less than 2.  His positive cognition was, "I need to take it one step at a time."  Interventions: Mindfulness Meditation, Motivational Interviewing, Solution-Oriented/Positive Psychology, [employer] Group Desensitization and Reprocessing (EMDR) and Insight-Oriented  Diagnosis:   ICD-10-CM   1. Generalized anxiety disorder  F41.1     Plan: Guitar playing, mindfulness, meditation, assertiveness, boundaries, radical acceptance, exercise, positive self talk, self-care.  Armenta Erskin, [facility]

## 2019-12-21 ENCOUNTER — Encounter: Payer: Self-pay | Admitting: Neurology

## 2019-12-21 ENCOUNTER — Ambulatory Visit: Payer: BC Managed Care – PPO | Admitting: Neurology

## 2019-12-21 VITALS — BP 143/71 | HR 58 | Ht 64.5 in | Wt 129.5 lb

## 2019-12-21 DIAGNOSIS — F419 Anxiety disorder, unspecified: Secondary | ICD-10-CM | POA: Diagnosis not present

## 2019-12-21 DIAGNOSIS — M6289 Other specified disorders of muscle: Secondary | ICD-10-CM | POA: Insufficient documentation

## 2019-12-21 DIAGNOSIS — R200 Anesthesia of skin: Secondary | ICD-10-CM

## 2019-12-21 DIAGNOSIS — R253 Fasciculation: Secondary | ICD-10-CM | POA: Insufficient documentation

## 2019-12-21 MED ORDER — BUSPIRONE HCL 15 MG PO TABS: 15.0000 mg | ORAL_TABLET | Freq: Two times a day (BID) | ORAL | 5 refills | Status: DC

## 2019-12-21 NOTE — Progress Notes (Signed)
[facility]  PATIENT: Collin Harris DOB: [birth year]  REFERRING DOCTOR OR PCP:  Kirby Funk, MD SOURCE: patient, notes from PCP  _________________________________   HISTORICAL  CHIEF COMPLAINT:  Chief Complaint  Patient presents with  . New Patient (Initial Visit)    RM 13, alone. Paper referral from Kirby Funk (PCP) for leg weakness-bilateral, fasciculations/numbness    HISTORY OF PRESENT ILLNESS:  I had the pleasure seeing your patient, Ewald Beg, at [facility] for neurologic consultation regarding his muscle fatigue and fasciculations.  He is a 54 year old man with stage III CKD.  He was placed on Lipitor prophylactically but felt subtle weakness in his thighs with easy fatiguing of the muscles..   While traveling in [place] he developed a GI syndrome.  He felt legs were doing worse.  He stopped Lipitor and felt mildly better.  Pravachol was started but he felt poor;y so he stopped all statins.   He also has noted fasciculations in his calves since about July.   Strength is no worse.  Currently, he notes fasciculations in the calves mostly, rarely below the eye.   Weight is stable over the last 6 months.  Strength is the same now as earlier in the year.   He is running less since he feels more fatigued.  He has a vague tingling below the knees but no fixed numbness.  No change in bowel or bladder.   Vision is stable.     In 2015, sertraline was started as he was noting stress related to his father's dementia.   He noted he had fasciculations and was referred to Neurology.  He had a NCV/EMG study around 2015 at [facility] (Dr. Craige Cotta).   He was told it was fine.  He has some anxiety, especially with healthcare issues.    Due to his experience with sertraline he has been reluctant to try other medications.      REVIEW OF SYSTEMS: Constitutional: No fevers, chills, sweats, or change in appetite Eyes: No visual changes, double  vision, eye pain Ear, nose and throat: No hearing loss, ear pain, nasal congestion, sore throat Cardiovascular: No chest pain, palpitations Respiratory: No shortness of breath at rest or with exertion.   No wheezes GastrointestinaI: No nausea, vomiting, diarrhea, abdominal pain, fecal incontinence Genitourinary: No dysuria, urinary retention or frequency.  No nocturia. Musculoskeletal: No neck pain, back pain.  As above Integumentary: No rash, pruritus, skin lesions Neurological: as above Psychiatric: No depression at this time.  No anxiety Endocrine: No palpitations, diaphoresis, change in appetite, change in weigh or increased thirst Hematologic/Lymphatic: No anemia, purpura, petechiae. Allergic/Immunologic: No itchy/runny eyes, nasal congestion, recent allergic reactions, rashes  ALLERGIES: Allergies  Allergen Reactions  . Codeine     REACTION: nausea    HOME MEDICATIONS:  Current Outpatient Medications:  .  cholecalciferol (VITAMIN D) 1000 UNITS tablet, Take 1,000 Units by mouth daily., Disp: , Rfl:  .  lisinopril (ZESTRIL) 2.5 MG tablet, Take 2.5 mg by mouth daily., Disp: , Rfl:  .  Omega-3 Fatty Acids (FISH OIL PO), Take 1,000 mg by mouth daily., Disp: , Rfl:   PAST MEDICAL HISTORY: Past Medical History:  Diagnosis Date  . Anxiety   . Seasonal allergies     PAST SURGICAL HISTORY: Past Surgical History:  Procedure Laterality Date  . INGUINAL HERNIA REPAIR Right 2001  . SKIN CANCER EXCISION  2008   Mohs basal cell carcinoma neck  . TONSILLECTOMY  1968  FAMILY HISTORY: History reviewed. No pertinent family history.  SOCIAL HISTORY:  Social History   Socioeconomic History  . Marital status: Married    Spouse name: Not on file  . Number of children: 0  . Years of education: Doctorate   . Highest education level: Not on file  Occupational History  . Occupation: [employer]   Tobacco Use  . Smoking status: Never Smoker  . Smokeless tobacco: Never Used    Substance and Sexual Activity  . Alcohol use: Yes    Alcohol/week: 2.0 standard drinks    Types: 2 Standard drinks or equivalent per week    Comment: 2 beers per week  . Drug use: No  . Sexual activity: Yes    Partners: Female  Other Topics Concern  . Not on file  Social History Narrative   Right handed   Coffee (1/2 cup per day)   Social Determinants of Health   Financial Resource Strain:   . Difficulty of Paying Living Expenses: Not on file  Food Insecurity:   . Worried About Programme researcher, broadcasting/film/video in the Last Year: Not on file  . Ran Out of Food in the Last Year: Not on file  Transportation Needs:   . Lack of Transportation (Medical): Not on file  . Lack of Transportation (Non-Medical): Not on file  Physical Activity:   . Days of Exercise per Week: Not on file  . Minutes of Exercise per Session: Not on file  Stress:   . Feeling of Stress : Not on file  Social Connections:   . Frequency of Communication with Friends and Family: Not on file  . Frequency of Social Gatherings with Friends and Family: Not on file  . Attends Religious Services: Not on file  . Active Member of Clubs or Organizations: Not on file  . Attends Banker Meetings: Not on file  . Marital Status: Not on file  Intimate Partner Violence:   . Fear of Current or Ex-Partner: Not on file  . Emotionally Abused: Not on file  . Physically Abused: Not on file  . Sexually Abused: Not on file     PHYSICAL EXAM  Vitals:   12/21/19 1005  BP: (!) 143/71  Pulse: (!) 58  Weight: 129 lb 8 oz (58.7 kg)  Height: 5' 4.5" (1.638 m)    Body mass index is 21.89 kg/m.   General: The patient is well-developed and well-nourished and in no acute distress  HEENT:  Head is [place]/AT.  Sclera are anicteric.  Funduscopic exam shows normal optic discs and retinal vessels.  Neck: No carotid bruits are noted.  The neck is nontender.  Cardiovascular: The heart has a regular rate and rhythm with a normal S1 and  S2. There were no murmurs, gallops or rubs.    Skin: Extremities are without rash or  edema.  Musculoskeletal:  Back is nontender  Neurologic Exam  Mental status: The patient is alert and oriented x 3 at the time of the examination. The patient has apparent normal recent and remote memory, with an apparently normal attention span and concentration ability.   Speech is normal.  Cranial nerves: Extraocular movements are full. Pupils are equal, round, and reactive to light and accomodation.  Visual fields are full.  Facial symmetry is present. There is good facial sensation to soft touch bilaterally.Facial strength is normal.  Trapezius and sternocleidomastoid strength is normal. No dysarthria is noted.  The tongue is midline, and the patient has symmetric elevation of  the soft palate. No obvious hearing deficits are noted.  Motor:  Muscle bulk is normal.   Tone is normal. Strength is  5 / 5 in all 4 extremities..  I was able to see one of the fasciculations in the left calf.  Of note, there is no muscle atrophy.  Sensory: Sensory testing is intact to pinprick, soft touch and vibration sensation in all 4 extremities.  Coordination: Cerebellar testing reveals good finger-nose-finger and heel-to-shin bilaterally.  Gait and station: Station is normal.   Gait is normal. Tandem gait is normal. Romberg is negative.   Reflexes: Deep tendon reflexes are symmetric and normal bilaterally.   Plantar responses are flexor.    DIAGNOSTIC DATA (LABS, IMAGING, TESTING) - I reviewed patient records, labs, notes, testing and imaging myself where available.  Lab Results  Component Value Date   WBC 5.0 09/04/2008   HGB 14.0 09/04/2008   HCT 39.6 09/04/2008   MCV 89.3 09/04/2008   PLT 165.0 09/04/2008      Component Value Date/Time   NA 145 09/04/2008 0901   K 3.8 09/04/2008 0901   CL 108 09/04/2008 0901   CO2 33 (H) 09/04/2008 0901   GLUCOSE 95 09/04/2008 0901   BUN 14 09/04/2008 0901    CREATININE 1.1 09/04/2008 0901   CALCIUM 9.7 09/04/2008 0901   PROT 7.2 09/04/2008 0901   ALBUMIN 4.3 09/04/2008 0901   AST 25 09/04/2008 0901   ALT 18 09/04/2008 0901   ALKPHOS 36 (L) 09/04/2008 0901   BILITOT 0.9 09/04/2008 0901   GFRNONAA 76.88 09/04/2008 0901   GFRAA 71 06/21/2007 1116   Lab Results  Component Value Date   CHOL 198 09/04/2008   HDL 59.60 09/04/2008   LDLCALC 124 (H) 09/04/2008   LDLDIRECT 143.0 06/21/2007   TRIG 71.0 09/04/2008   CHOLHDL 3 09/04/2008   Lab Results  Component Value Date   HGBA1C 6.0 09/04/2008   No results found for: VITAMINB12 Lab Results  Component Value Date   TSH 1.36 09/04/2008       ASSESSMENT AND PLAN  Muscle fatigue - Plan: NCV with EMG(electromyography)  Numbness - Plan: NCV with EMG(electromyography)  Fasciculation - Plan: NCV with EMG(electromyography)  Anxiety   In summary, Mr. Brisendine is a 54 year old man with muscle fatigue that started after being placed on Lipitor but persisted when the statin was discontinued.  Over the past couple months, he is also noted fasciculations.  His neurologic examination is normal.  It is possible that the statins did cause some muscle fatigue.  Creatinine kinase was normal so this cannot be certain.   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  However, we do need to check an NCV/EMG study to rule out myopathy and motor neuron disease.   She also has anxiety.  He noted more fasciculations after being on Zoloft 5 years ago so prefers not to be placed on an SSRI.  I will have him try BuSpar as that medication is generally well-tolerated.  I will see him when he returns for the NCV/EMG study.  He should call sooner if he has significant new or worsening neurologic symptoms.  Thank you for asking me to see Mr. Chiasson.  Please let me know if I can be of further assistance with him or other patients in the  future.   Luke Falero A. Epimenio Foot, MD, [facility] 12/21/2019, 10:46 AM Certified in Neurology, Clinical Neurophysiology, Sleep Medicine and  Neuroimaging  [facility] [address], [place], [place] [address] [phone]

## 2020-01-19 ENCOUNTER — Ambulatory Visit: Payer: BC Managed Care – PPO | Admitting: Psychiatry

## 2020-01-22 ENCOUNTER — Ambulatory Visit: Payer: Self-pay | Admitting: Neurology

## 2020-02-06 ENCOUNTER — Ambulatory Visit (INDEPENDENT_AMBULATORY_CARE_PROVIDER_SITE_OTHER): Payer: BC Managed Care – PPO | Admitting: Neurology

## 2020-02-06 ENCOUNTER — Other Ambulatory Visit: Payer: Self-pay

## 2020-02-06 ENCOUNTER — Encounter: Payer: BC Managed Care – PPO | Admitting: Neurology

## 2020-02-06 DIAGNOSIS — M6289 Other specified disorders of muscle: Secondary | ICD-10-CM

## 2020-02-06 DIAGNOSIS — R200 Anesthesia of skin: Secondary | ICD-10-CM

## 2020-02-06 DIAGNOSIS — Z0289 Encounter for other administrative examinations: Secondary | ICD-10-CM

## 2020-02-06 DIAGNOSIS — R253 Fasciculation: Secondary | ICD-10-CM | POA: Diagnosis not present

## 2020-02-06 NOTE — Progress Notes (Signed)
Full Name: Daden Mahany Gender: Male MRN #: [id number] Date of Birth: May 02, 1965    Visit Date: 02/06/2020 07:21 Age: 54 Years Examining Physician: Despina Arias, MD  Referring Physician: Despina Arias, MD Height: 5 feet 4 inch Patient History: 129lbs     History: Mr. Gelin is a 54 year old man with fasciculations.  They were occurring in the arms or legs but more recently only occurring in the calves.  He denies any weakness.  There is currently no muscle pain though he had some earlier  Nerve conduction studies:  The left median, ulnar, peroneal and tibial motor responses had normal distal latencies, amplitudes and conduction velocities.  Tibial and ulnar F-wave latencies were normal.  The left median, ulnar, sural and superficial peroneal sensory responses had normal peak latencies and amplitudes.  Electromyography: Needle EMG of selected muscles of the left leg was performed.  There were a few polyphasic motor units in the abductor hallucis muscle but recruitment was normal.  Other muscles had normal motor unit action potentials and recruitment.  There was no abnormal spontaneous activity.  Impression: This is a normal NCV/EMG study.  There is no evidence of motor neuron disease, myopathy, significant radiculopathy or neuropathy.  Koree Schopf A. Epimenio Foot, MD, PhD, FAAN Certified in Neurology, Clinical Neurophysiology, Sleep Medicine, Pain Medicine and Neuroimaging Director, [facility]  [facility] [address] [place], [place] [address] [phone]   Verbal informed consent was obtained from the patient, patient was informed of potential risk of procedure, including bruising, bleeding, hematoma formation, infection, muscle weakness, muscle pain, numbness, among others.         MNC    Nerve / Sites Muscle Latency Ref. Amplitude Ref. Rel Amp Segments Distance Velocity Ref. Area    ms ms mV mV %   cm m/s m/s mVms  L Median - APB     Wrist APB 4.1 ?4.4 8.6 ?4.0 100 Wrist - APB 7   35.9     Upper arm APB 8.7  8.8  101 Upper arm - Wrist 24 53 ?49 35.4  L Ulnar - ADM     Wrist ADM 3.3 ?3.3 9.8 ?6.0 100 Wrist - ADM 7   41.4     B.Elbow ADM 7.4  9.3  95.4 B.Elbow - Wrist 21 52 ?49 40.1     A.Elbow ADM 9.4  8.9  95.8 A.Elbow - B.Elbow 10 51 ?49 39.7         A.Elbow - Wrist      L Peroneal - EDB     Ankle EDB 4.8 ?6.5 7.1 ?2.0 100 Ankle - EDB 9   23.2     Fib head EDB 10.5  6.6  92.3 Fib head - Ankle 26 45 ?44 22.1     Pop fossa EDB 12.8  6.4  97.4 Pop fossa - Fib head 10 44 ?44 22.0         Pop fossa - Ankle      L Tibial - AH     Ankle AH 4.6 ?5.8 13.0 ?4.0 100 Ankle - AH 9   32.9     Pop fossa AH 12.7  10.6  81.3 Pop fossa - Ankle 34 42 ?41 33.3             SNC    Nerve / Sites Rec. Site Peak Lat Ref.  Amp Ref. Segments Distance    ms ms V V  cm  L Sural - Ankle (Calf)     Calf Ankle 4.4 ?4.4 17 ?6 Calf - Ankle 14  L Superficial peroneal - Ankle     Lat leg Ankle 3.7 ?4.4 8 ?6 Lat leg - Ankle 14  L Median - Orthodromic (Dig II, Mid palm)     Dig II Wrist 3.4 ?3.4 26 ?10 Dig II - Wrist 13  L Ulnar - Orthodromic, (Dig V, Mid palm)     Dig V Wrist 3.1 ?3.1 14 ?5 Dig V - Wrist 14             F  Wave    Nerve F Lat Ref.   ms ms  L Tibial - AH 52.9 ?56.0  L Ulnar - ADM 30.4 ?32.0         EMG Summary Table    Spontaneous MUAP Recruitment  Muscle IA Fib PSW Fasc Other Amp Dur. Poly Pattern  L. Vastus medialis Normal None None None _______ Normal Normal Normal Normal  L. Gastrocnemius (Medial head) Normal None None None _______ Normal Normal Normal Normal  L. Peroneus longus Normal None None None _______ Normal Normal Normal Normal  L. Tibialis anterior Normal None None None _______ Normal Normal Normal Normal  L. Abductor hallucis Normal None None None _______ Normal Normal 1+ Normal  L. Iliopsoas Normal None None None _______ Normal Normal Normal Normal  L. Gluteus medius  Normal None None None _______ Normal Normal Normal Normal      [facility]  PATIENT: JANSON LAMAR DOB: 1966-03-23  REFERRING DOCTOR OR PCP:  Kirby Funk, MD SOURCE: patient, notes from PCP  _________________________________   HISTORICAL  CHIEF COMPLAINT:  Chief Complaint  Patient presents with   Muscle Pain    And fasciculations    HISTORY OF PRESENT ILLNESS:  Ira Dougher is a 54 year old man with stage III CKD.  He was placed on Lipitor prophylactically but felt subtle weakness in his thighs with easy fatiguing of the muscles..    He also has noted fasciculations in his calves since about July.   He has not noted any weakness.  He has not noted any atrophy.  Fasciculations have generally done better compared to his initial visit.  He still notes the occasional 1 in the calf.  He also has had anxiety.  BuSpar was added at the last visit and he feels there is some benefit.  EMG/NCV today was essentially normal.   REVIEW OF SYSTEMS: Constitutional: No fevers, chills, sweats, or change in appetite Eyes: No visual changes, double vision, eye pain Ear, nose and throat: No hearing loss, ear pain, nasal congestion, sore throat Cardiovascular: No chest pain, palpitations Respiratory: No shortness of breath at rest or with exertion.   No wheezes GastrointestinaI: No nausea, vomiting, diarrhea, abdominal pain, fecal incontinence Genitourinary: No dysuria, urinary retention or frequency.  No nocturia. Musculoskeletal: No neck pain, back pain.  As above Integumentary: No rash, pruritus, skin lesions Neurological: as above Psychiatric: No depression at this time.  No anxiety Endocrine: No palpitations, diaphoresis, change in appetite, change in weigh or increased thirst Hematologic/Lymphatic: No anemia, purpura, petechiae. Allergic/Immunologic: No itchy/runny eyes, nasal congestion, recent allergic reactions, rashes  ALLERGIES: Allergies  Allergen  Reactions   Codeine     REACTION: nausea    HOME MEDICATIONS:  Current Outpatient Medications:    busPIRone (BUSPAR) 15 MG tablet, Take 1 tablet (15 mg total) by mouth 2 (two) times daily., Disp: 60 tablet, Rfl:  5   cholecalciferol (VITAMIN D) 1000 UNITS tablet, Take 1,000 Units by mouth daily., Disp: , Rfl:    lisinopril (ZESTRIL) 2.5 MG tablet, Take 2.5 mg by mouth daily., Disp: , Rfl:    Omega-3 Fatty Acids (FISH OIL PO), Take 1,000 mg by mouth daily., Disp: , Rfl:   PAST MEDICAL HISTORY: Past Medical History:  Diagnosis Date   Anxiety    Seasonal allergies     PAST SURGICAL HISTORY: Past Surgical History:  Procedure Laterality Date   INGUINAL HERNIA REPAIR Right 2001   SKIN CANCER EXCISION  2008   Mohs basal cell carcinoma neck   TONSILLECTOMY  1968    FAMILY HISTORY: No family history on file.       PHYSICAL EXAM    General: The patient is well-developed and well-nourished and in no acute distress  HEENT:  Head is [place]/AT.   Neurologic Exam  Mental status: The patient is alert and oriented.   Speech is normal.  Cranial nerves: Extraocular movements are full. Facial strength is normal.    Motor:  Muscle bulk is normal.   Tone is normal. Strength is  5 / 5 in all 4 extremities..   No fasciculations were seen during the exam or during the EMG.  Gait and station: Station is normal.   Gait is normal.        ASSESSMENT AND PLAN  Muscle fatigue - Plan: NCV with EMG(electromyography)  Numbness - Plan: NCV with EMG(electromyography)  Fasciculation - Plan: NCV with EMG(electromyography)  1.   We had a discussion about the findings.  There is no evidence of amyotrophic lateral sclerosis.  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  These should not be associated with any weakness or other important symptom. 2.  Anxiety is doing better on BuSpar and he will continue. 3.  Return to see me as needed if there are new or worsening neurologic  symptoms.    Nicholl Onstott A. Epimenio Foot, MD, [facility] 02/06/2020, 9:51 AM Certified in Neurology, Clinical Neurophysiology, Sleep Medicine and Neuroimaging  [facility] [address] [place], [place] [address] [phone]

## 2020-02-16 ENCOUNTER — Encounter: Payer: Self-pay | Admitting: Psychiatry

## 2020-02-16 ENCOUNTER — Ambulatory Visit (INDEPENDENT_AMBULATORY_CARE_PROVIDER_SITE_OTHER): Payer: BC Managed Care – PPO | Admitting: Psychiatry

## 2020-02-16 ENCOUNTER — Other Ambulatory Visit: Payer: Self-pay

## 2020-02-16 DIAGNOSIS — F411 Generalized anxiety disorder: Secondary | ICD-10-CM

## 2020-02-16 NOTE — Progress Notes (Signed)
[facility] Counselor/Therapist Progress Note  Patient ID: DYLIN BREEDEN, MRN: [id number],    Date: 02/16/2020  Time Spent: 50 minutes   Treatment Type: Individual Therapy  Reported Symptoms: anxiety, rumination  Mental Status Exam:  Appearance:   Well Groomed     Behavior:  Appropriate  Motor:  Normal  Speech/Language:   Clear and Coherent  Affect:  Appropriate  Mood:  anxious  Thought process:  normal  Thought content:    Rumination  Sensory/Perceptual disturbances:    WNL  Orientation:  oriented to person, place, time/date and situation  Attention:  Good  Concentration:  Good  Memory:  WNL  Fund of knowledge:   Good  Insight:    Good  Judgment:   Good  Impulse Control:  Good   Risk Assessment: Danger to Self:  No Self-injurious Behavior: No Danger to Others: No Duty to Warn:no Physical Aggression / Violence:No  Access to Firearms a concern: No  Gang Involvement:No   Subjective: The client states that this past semester at [employer] has been very difficult.  "I have had so much healthcare anxiety personally.  I am especially stressing out about my kidneys."  The client has a GFR of 50.  He is worried about the consequences of stage IIIa kidney disease on his heart.  He finds himself excessively worried and ruminating about premature death or when his health might suddenly turn for the worse.  I used eye-movement today with the client focusing on his healthcare anxiety.  His negative cognition is, "I will die prematurely.  My health will suddenly turn."  As the client processed he stated that he was meeting with a dietitian that specializes in diets for kidney disease.  The client also talks with his internist on a regular basis wanting him to run tests or doing a urinalysis to see if he is spilling protein.  The client agrees that this Lavar Rosenzweig be a little over the top.  As he continued to process his anxiety began to calm down.  I pointed out that he has been very  proactive in the care of his body.  He acknowledged that.  He has also started on a vegan diet which has been good but caused some weight loss.  The client has a slight build to start with.  The client agreed and he is trying to balance things out.  I also noted that both of his parents lived into their 20s.  This should be positive data that he can look at.  As he continued to process he was able to get his subjective units of distress down to a 3.  His positive cognition was, "my body will do what it is supposed to do."  The client acknowledged that if he just did the right things his body should work correctly. The client has also started dating a woman in [place], [place] who has 3 dogs but no children.  They have been seeing each other every weekend alternately.  He states that his been very positive and they seem to get along well.  This is been a brighter spot in the client's life.  Interventions: Mindfulness Meditation, Motivational Interviewing, Solution-Oriented/Positive Psychology, [employer] Desensitization [employer] Reprocessing (EMDR) and Insight-Oriented  Diagnosis:   ICD-10-CM   1. Generalized anxiety disorder  F41.1     Plan: Mood independent behavior, self-care, positive self talk, radical acceptance, exercise (balanced), meet with dietitian, contact a direct primary care doctor.  Montina Dorrance,  LCMHCS

## 2020-05-03 ENCOUNTER — Ambulatory Visit: Payer: BC Managed Care – PPO | Admitting: Psychiatry

## 2020-05-24 ENCOUNTER — Other Ambulatory Visit: Payer: Self-pay

## 2020-05-24 ENCOUNTER — Encounter: Payer: Self-pay | Admitting: Psychiatry

## 2020-05-24 ENCOUNTER — Ambulatory Visit (INDEPENDENT_AMBULATORY_CARE_PROVIDER_SITE_OTHER): Payer: BC Managed Care – PPO | Admitting: Psychiatry

## 2020-05-24 DIAGNOSIS — F411 Generalized anxiety disorder: Secondary | ICD-10-CM | POA: Diagnosis not present

## 2020-05-24 NOTE — Progress Notes (Signed)
      [facility] Counselor/Therapist Progress Note  Patient ID: Collin Harris, MRN: [id number],    Date: 05/24/2020  Time Spent: 59 minutes   Treatment Type: Individual Therapy  Reported Symptoms: anxiety  Mental Status Exam:  Appearance:   Well Groomed     Behavior:  Appropriate  Motor:  Normal  Speech/Language:   Clear and Coherent  Affect:  Appropriate  Mood:  anxious  Thought process:  normal  Thought content:    Rumination  Sensory/Perceptual disturbances:    WNL  Orientation:  oriented to person, place, time/date and situation  Attention:  Good  Concentration:  Good  Memory:  WNL  Fund of knowledge:   Good  Insight:    Good  Judgment:   Good  Impulse Control:  Good   Risk Assessment: Danger to Self:  No Self-injurious Behavior: No Danger to Others: No Duty to Warn:no Physical Aggression / Violence:No  Access to Firearms a concern: No  Gang Involvement:No   Subjective: The client states that he continues to be worried about health concerns.  He followed up with a nephrologist at [facility].  There was a positive outcome.  The nephrologist found that the ultrasound report had a typo in it.  The client's kidney size is not 6 cm but 8.6 cm.  Today the client's anxiety is a subjective units of distress of 4.  He states he has a lot of existential angst about his dogs getting older.  He and his ex-wife share custody of their 2 dogs.  The client states he has probably spent $20,000 on them for vet bills.  He states he will not do well when they pass away.  We discussed the fact at this point the client's social network is very small.  The woman he was dating with 3 dogs in November broke up with him right before Christmas.  "There was no chemistry."  The client is not sure what all that means.  He has been very discouraged with the dating apps.  I suggested to the client that he try one called "hinge".  Women have suggested that they like it better.  The client will consider  this. We discussed other ways of him expanding his social network.  The client is considering returning to [facility].  I suggested to the client if he does do that to try to get involved in some of the activities there or he can meet other people his age.  The client is not hopeful that he will try to do so.  The client's subjective units of distress was less than 2 at the end of the session.  Interventions: Assertiveness/Communication, Motivational Interviewing, Solution-Oriented/Positive Psychology and Insight-Oriented  Diagnosis:   ICD-10-CM   1. Generalized anxiety disorder  F41.1     Plan: Mood independent behavior, positive self talk, self-care, assertiveness, return to his home church, joining other social activities there.  Gelene Mink Eriel Doyon, [facility]

## 2020-11-29 ENCOUNTER — Other Ambulatory Visit: Payer: Self-pay | Admitting: Internal Medicine

## 2020-11-29 DIAGNOSIS — E7800 Pure hypercholesterolemia, unspecified: Secondary | ICD-10-CM

## 2020-12-18 ENCOUNTER — Ambulatory Visit
Admission: RE | Admit: 2020-12-18 | Discharge: 2020-12-18 | Disposition: A | Discharge status: Home / Self Care | Payer: No Typology Code available for payment source | Source: Ambulatory Visit | Attending: Internal Medicine | Admitting: Internal Medicine

## 2020-12-18 ENCOUNTER — Other Ambulatory Visit: Payer: Self-pay

## 2020-12-18 DIAGNOSIS — E7800 Pure hypercholesterolemia, unspecified: Secondary | ICD-10-CM

## 2021-02-09 IMAGING — CR DG FINGER MIDDLE 2+V*L*
3 series · 3 of 3 positions shown · non-contrast
Comparison: None.

CLINICAL DATA: Injured middle digit

EXAM:
LEFT MIDDLE FINGER 2+V

[x finger pa left]
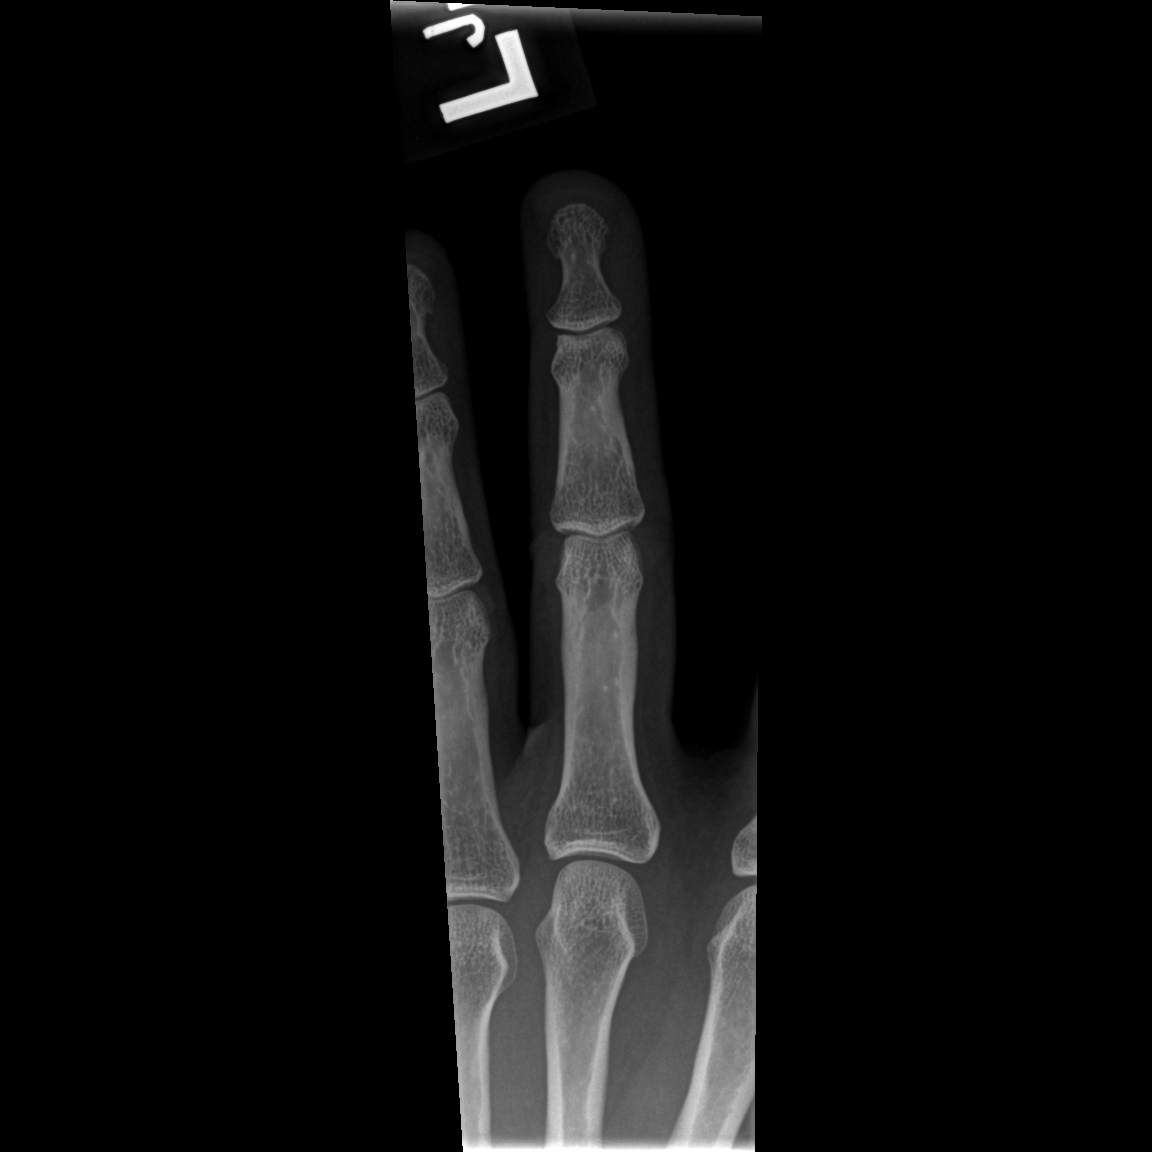

[x finger obl. left]
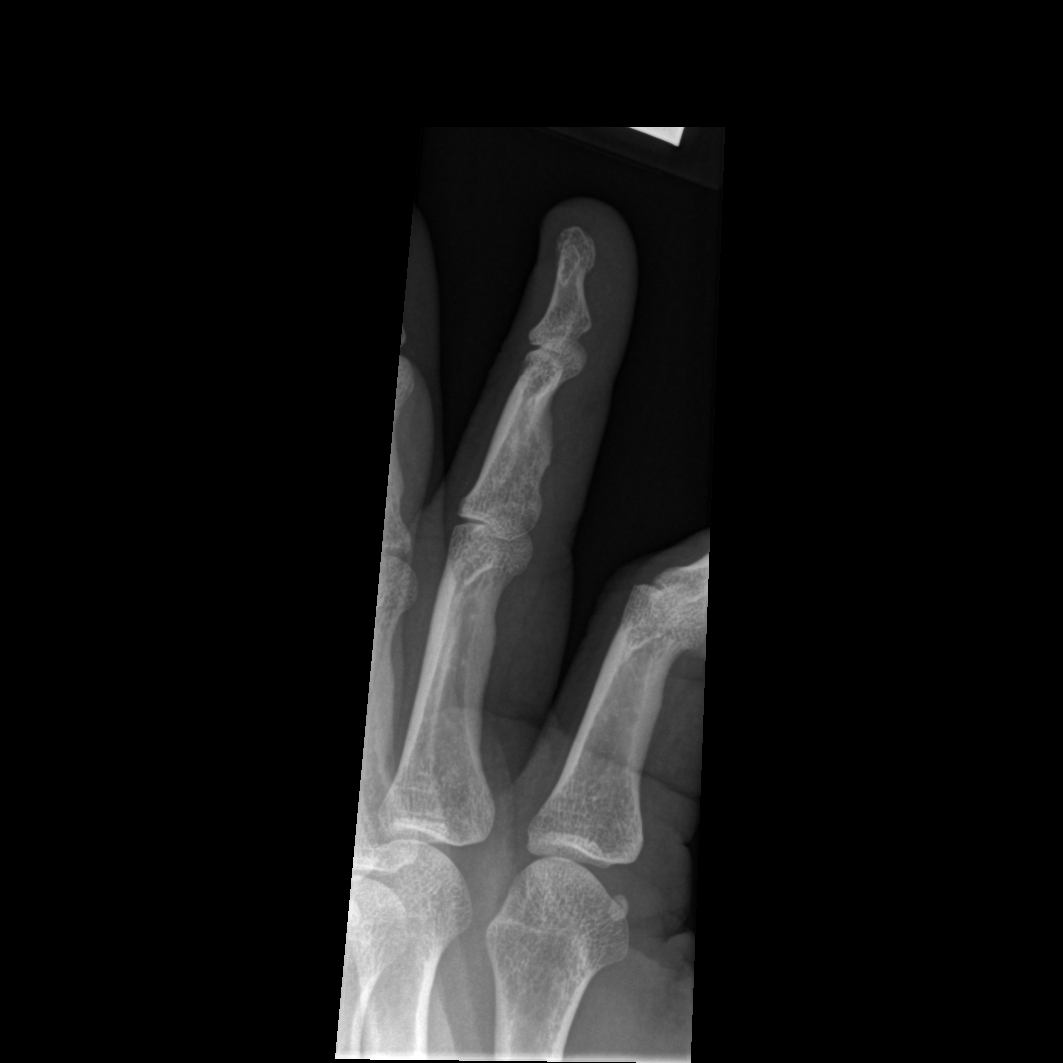

[x finger lateral left]
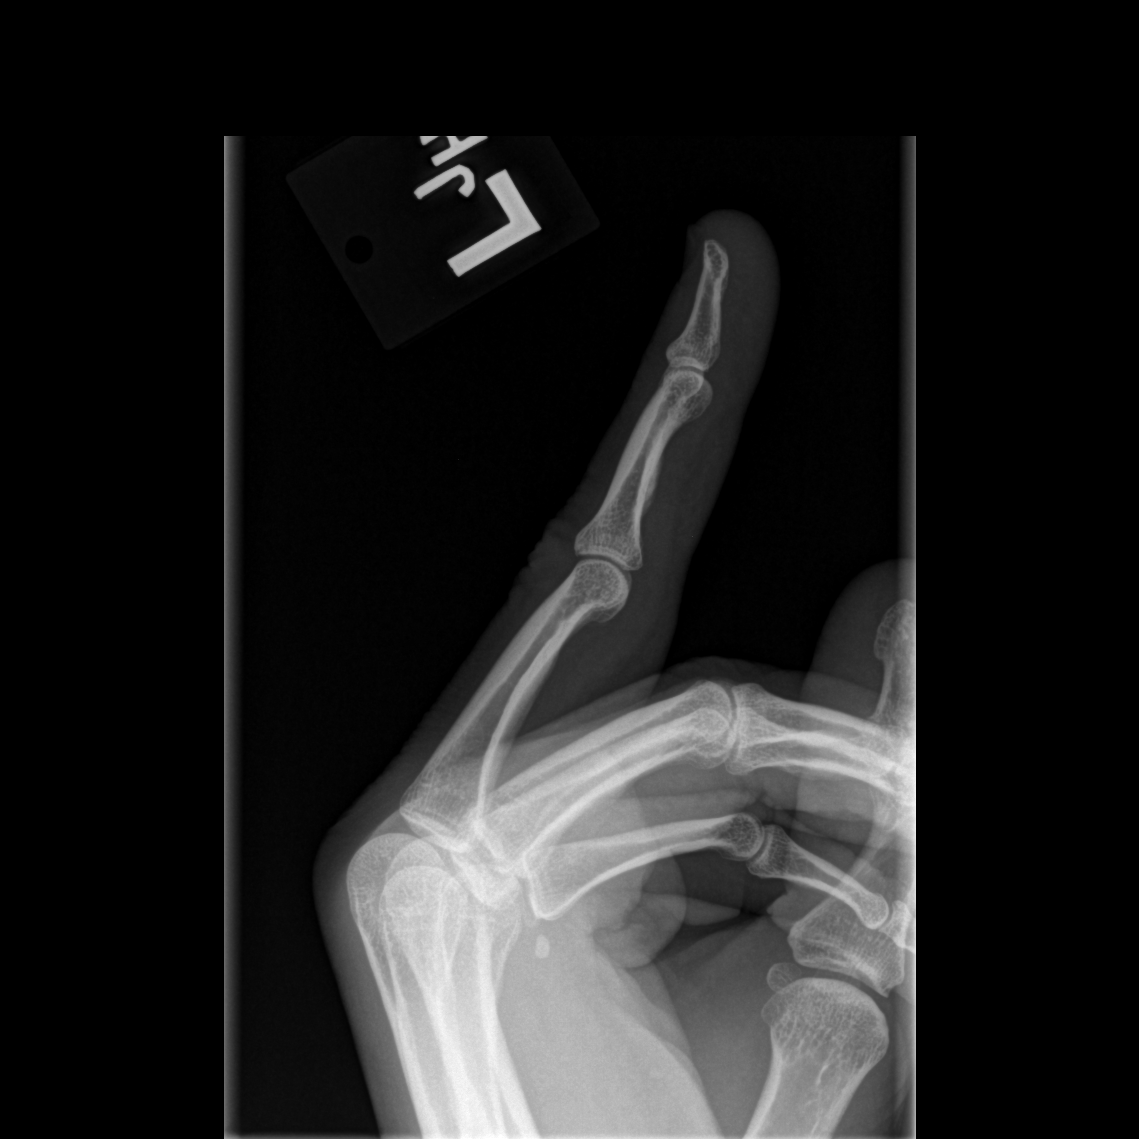

[3 of 3 positions shown; findings below may reference images not displayed]

FINDINGS: There is no evidence of fracture or dislocation. There is no
evidence of arthropathy or other focal bone abnormality. Soft
tissues are unremarkable.
IMPRESSION: Negative.

## 2021-06-29 IMAGING — US US RENAL
1 series · 13 of 25 positions shown · non-contrast
Comparison: None available.
COMPARISON: None available.

Addendum:
CLINICAL DATA: Initial evaluation for chronic kidney disease.

EXAM:
RENAL / URINARY TRACT ULTRASOUND COMPLETE

[Series 1: us renal · 0.20mm/px · 13 of 40 slices shown]
[im 1/40]
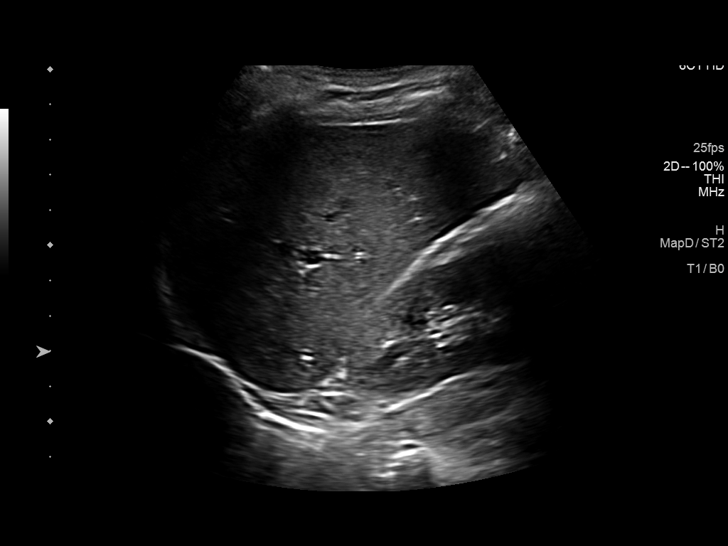
[im 4/40]
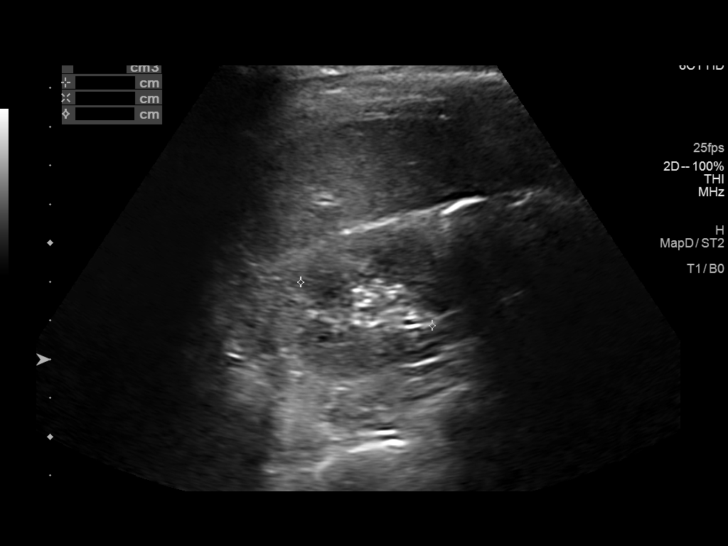
[im 7/40]
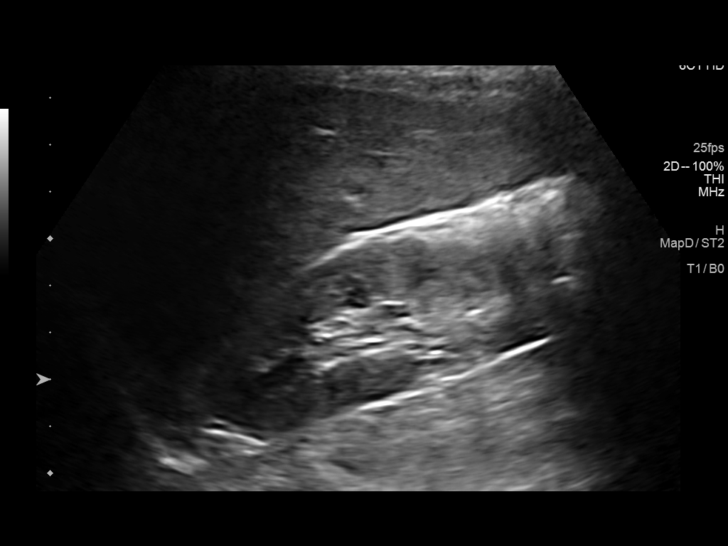
[im 10/40]
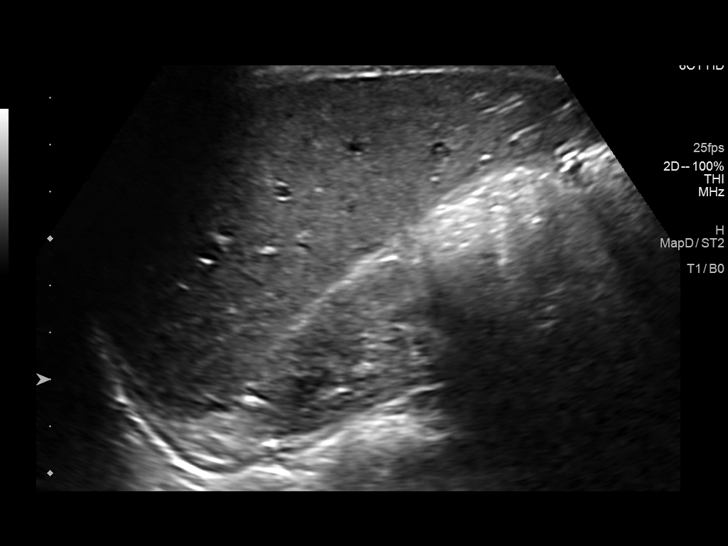
[im 14/40]
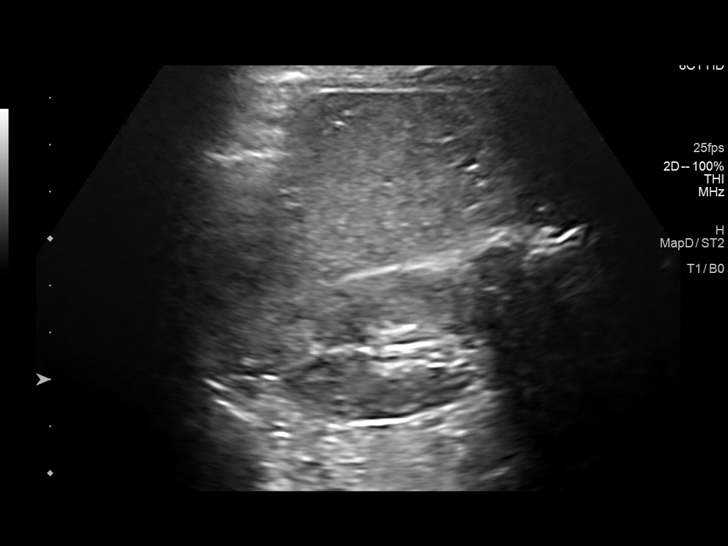
[im 17/40]
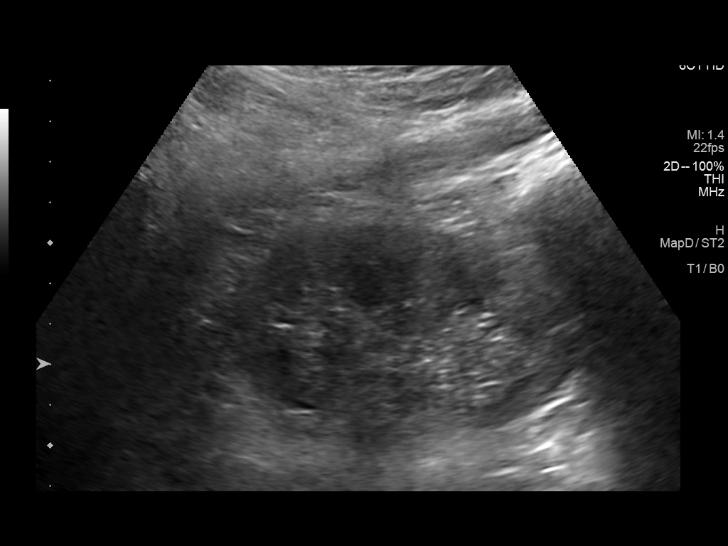
[im 20/40]
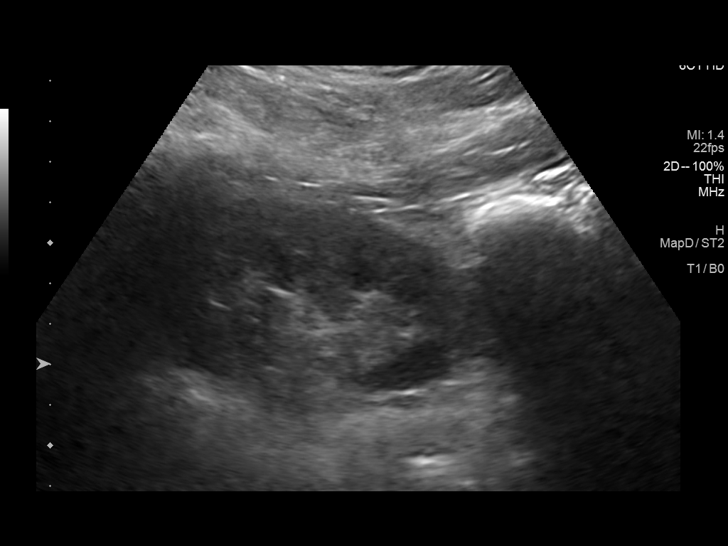
[im 23/40]
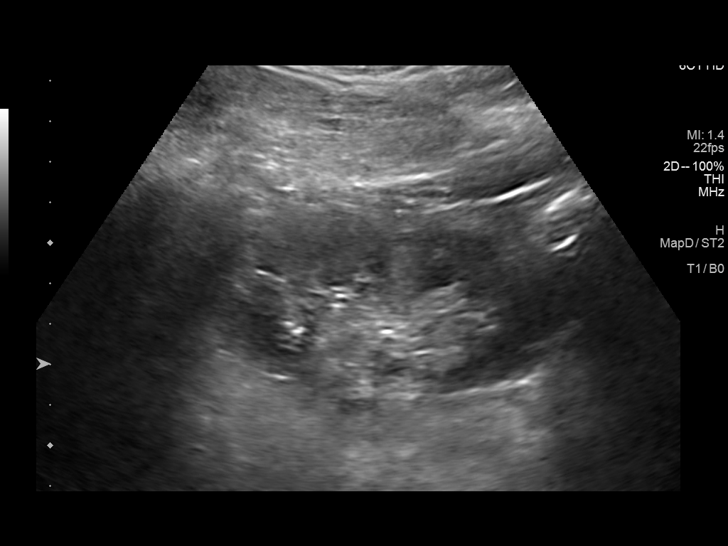
[im 27/40]
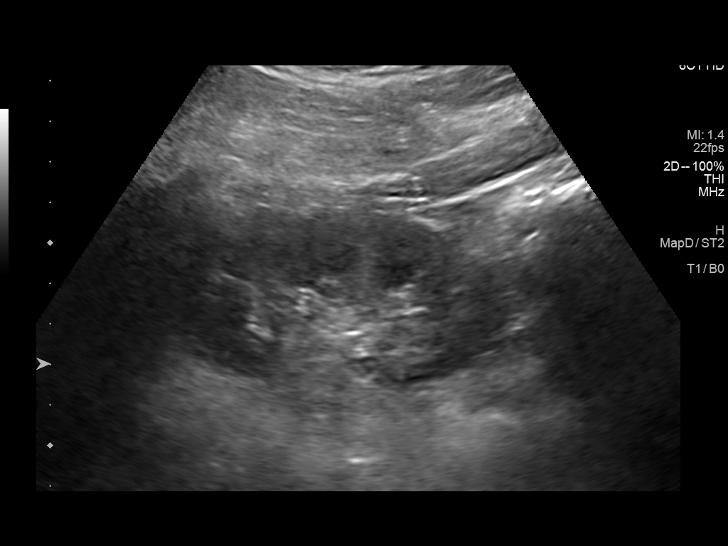
[im 30/40]
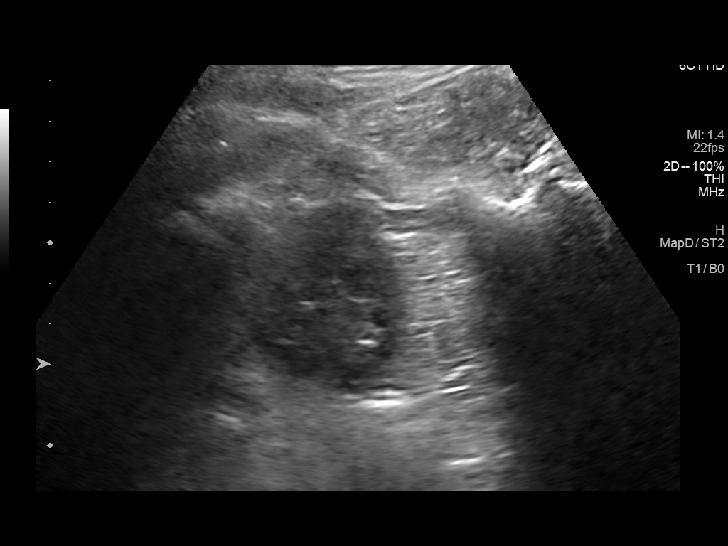
[im 33/40]
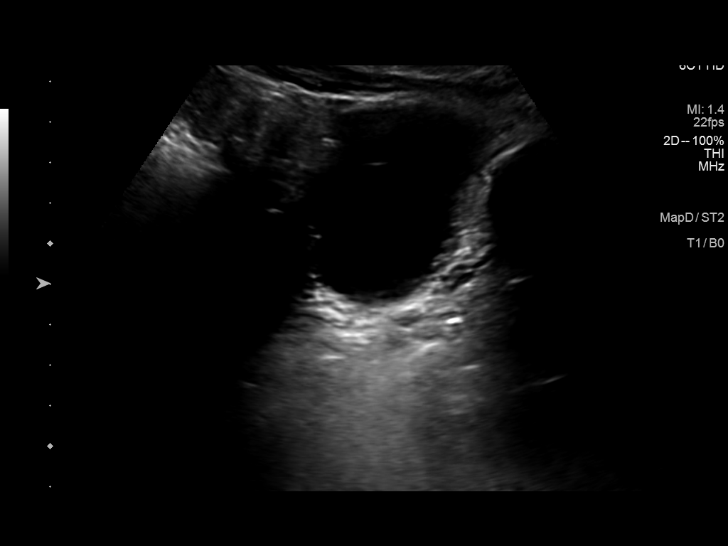
[im 36/40]
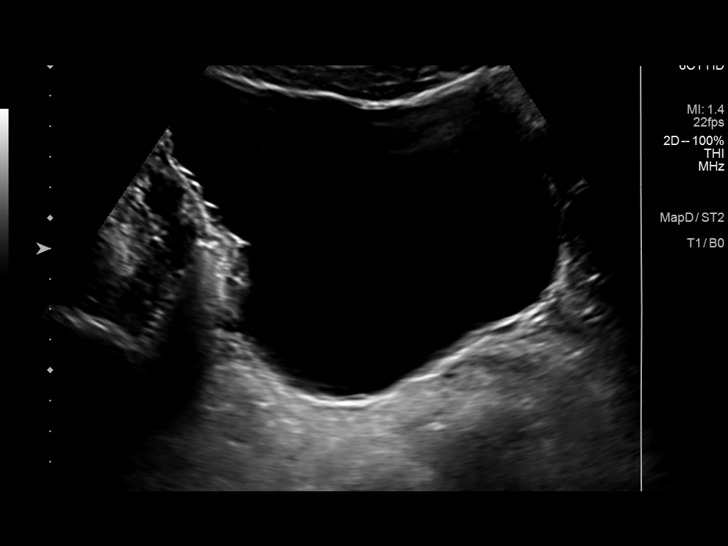
[im 40/40]
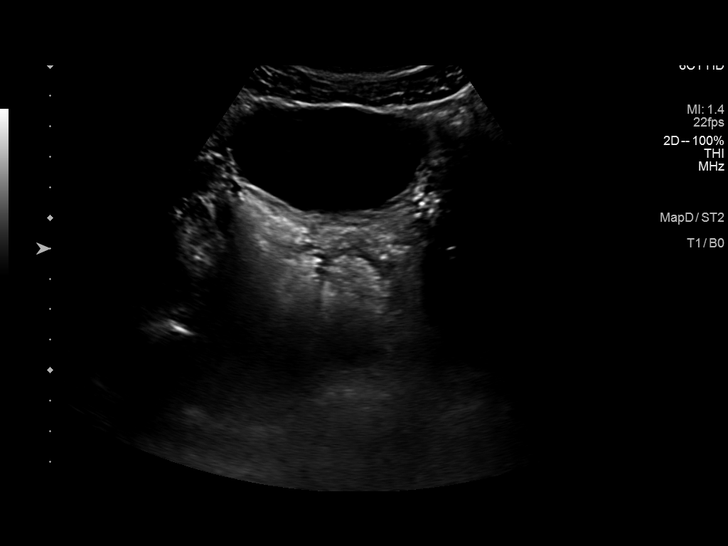

[13 of 25 positions shown; findings below may reference images not displayed]

FINDINGS: Right Kidney:

Renal measurements: 9.3 x 3.1 x 3.6 cm = volume: 54.4 mL. Increased
echogenicity seen within the renal parenchyma. No nephrolithiasis or
hydronephrosis. No focal renal mass.

Left Kidney:

Renal measurements:

6 x 4.4 x 4.3 cm = volume: 83.8 mL. Increased echogenicity seen
within the renal parenchyma. No nephrolithiasis or hydronephrosis.
No focal renal mass.

Bladder:

Appears normal for degree of bladder distention.

Other:

Prostate measures 2.7 x 3.2 x 3.6 cm.
IMPRESSION: 1. Increased echogenicity within the renal parenchyma, consistent
with medical renal disease.
2. No hydronephrosis or other significant finding.

ADDENDUM:
There is a dictation error in the body of the report. The left renal
measurements should read: 8.6 x 4.4 x 4.3 cm, with a renal volume of
the 83.8 mL.

*** End of Addendum ***
FINDINGS: Right Kidney:

Renal measurements: 9.3 x 3.1 x 3.6 cm = volume: 54.4 mL. Increased
echogenicity seen within the renal parenchyma. No nephrolithiasis or
hydronephrosis. No focal renal mass.

Left Kidney:

Renal measurements:

6 x 4.4 x 4.3 cm = volume: 83.8 mL. Increased echogenicity seen
within the renal parenchyma. No nephrolithiasis or hydronephrosis.
No focal renal mass.

Bladder:

Appears normal for degree of bladder distention.

Other:

Prostate measures 2.7 x 3.2 x 3.6 cm.
IMPRESSION: 1. Increased echogenicity within the renal parenchyma, consistent
with medical renal disease.
2. No hydronephrosis or other significant finding.

## 2023-06-11 ENCOUNTER — Other Ambulatory Visit (HOSPITAL_BASED_OUTPATIENT_CLINIC_OR_DEPARTMENT_OTHER): Payer: Self-pay

## 2023-06-11 MED ORDER — CAPVAXIVE 0.5 ML IM SOSY
0.5000 mL | PREFILLED_SYRINGE | Freq: Once | INTRAMUSCULAR | 0 refills | Status: AC
  Filled 2023-06-11: qty 0.5, 1d supply, fill #0

## 2023-12-21 ENCOUNTER — Other Ambulatory Visit (HOSPITAL_BASED_OUTPATIENT_CLINIC_OR_DEPARTMENT_OTHER): Payer: Self-pay

## 2023-12-21 MED ORDER — ONDANSETRON 8 MG PO TBDP
8.0000 mg | ORAL_TABLET | Freq: Three times a day (TID) | ORAL | 0 refills | Status: DC | PRN
  Filled 2023-12-21: qty 9, 3d supply, fill #0

## 2023-12-21 MED ORDER — PAXLOVID (150/100) 10 X 150 MG & 10 X 100MG PO TBPK
ORAL_TABLET | ORAL | 0 refills | Status: DC
  Filled 2023-12-21: qty 20, 5d supply, fill #0

## 2023-12-21 MED ORDER — PAXLOVID (300/100) 20 X 150 MG & 10 X 100MG PO TBPK
ORAL_TABLET | ORAL | 0 refills | Status: DC
  Filled 2023-12-21: qty 30, 5d supply, fill #0

## 2024-01-19 ENCOUNTER — Other Ambulatory Visit (HOSPITAL_BASED_OUTPATIENT_CLINIC_OR_DEPARTMENT_OTHER): Payer: Self-pay

## 2024-01-19 MED ORDER — TELMISARTAN 20 MG PO TABS
20.0000 mg | ORAL_TABLET | Freq: Every day | ORAL | 3 refills | Status: AC
  Filled 2024-01-19: qty 90, 90d supply, fill #0
  Filled 2024-04-01 – 2024-04-04 (×3): qty 90, 90d supply, fill #1

## 2024-01-19 MED ORDER — FLUVASTATIN SODIUM ER 80 MG PO TB24
80.0000 mg | ORAL_TABLET | Freq: Every day | ORAL | 3 refills | Status: AC
  Filled 2024-01-19 – 2024-04-04 (×4): qty 90, 90d supply, fill #0

## 2024-01-20 ENCOUNTER — Other Ambulatory Visit (HOSPITAL_BASED_OUTPATIENT_CLINIC_OR_DEPARTMENT_OTHER): Payer: Self-pay

## 2024-01-20 ENCOUNTER — Other Ambulatory Visit: Payer: Self-pay

## 2024-01-22 ENCOUNTER — Other Ambulatory Visit (HOSPITAL_BASED_OUTPATIENT_CLINIC_OR_DEPARTMENT_OTHER): Payer: Self-pay

## 2024-01-25 ENCOUNTER — Other Ambulatory Visit (HOSPITAL_BASED_OUTPATIENT_CLINIC_OR_DEPARTMENT_OTHER): Payer: Self-pay

## 2024-01-25 MED ORDER — EZETIMIBE 10 MG PO TABS
10.0000 mg | ORAL_TABLET | Freq: Every day | ORAL | 2 refills | Status: DC
  Filled 2024-01-25: qty 30, 30d supply, fill #0
  Filled 2024-02-18: qty 30, 30d supply, fill #1
  Filled 2024-03-18 (×2): qty 30, 30d supply, fill #2

## 2024-02-19 ENCOUNTER — Other Ambulatory Visit (HOSPITAL_BASED_OUTPATIENT_CLINIC_OR_DEPARTMENT_OTHER): Payer: Self-pay

## 2024-03-08 ENCOUNTER — Other Ambulatory Visit (HOSPITAL_BASED_OUTPATIENT_CLINIC_OR_DEPARTMENT_OTHER): Payer: Self-pay

## 2024-03-08 MED ORDER — COMIRNATY 30 MCG/0.3ML IM SUSY
0.3000 mL | PREFILLED_SYRINGE | Freq: Once | INTRAMUSCULAR | 0 refills | Status: AC
  Filled 2024-03-08: qty 0.3, 1d supply, fill #0

## 2024-03-18 ENCOUNTER — Other Ambulatory Visit (HOSPITAL_BASED_OUTPATIENT_CLINIC_OR_DEPARTMENT_OTHER): Payer: Self-pay

## 2024-03-29 ENCOUNTER — Other Ambulatory Visit (HOSPITAL_BASED_OUTPATIENT_CLINIC_OR_DEPARTMENT_OTHER): Payer: Self-pay

## 2024-04-01 ENCOUNTER — Other Ambulatory Visit (HOSPITAL_BASED_OUTPATIENT_CLINIC_OR_DEPARTMENT_OTHER): Payer: Self-pay

## 2024-04-03 ENCOUNTER — Other Ambulatory Visit (HOSPITAL_BASED_OUTPATIENT_CLINIC_OR_DEPARTMENT_OTHER): Payer: Self-pay

## 2024-04-03 MED ORDER — EZETIMIBE 10 MG PO TABS
10.0000 mg | ORAL_TABLET | Freq: Every day | ORAL | 2 refills | Status: AC
  Filled 2024-04-03 – 2024-04-10 (×2): qty 30, 30d supply, fill #0
  Filled 2024-05-17: qty 30, 30d supply, fill #1

## 2024-04-04 ENCOUNTER — Other Ambulatory Visit (HOSPITAL_BASED_OUTPATIENT_CLINIC_OR_DEPARTMENT_OTHER): Payer: Self-pay

## 2024-04-04 ENCOUNTER — Other Ambulatory Visit: Payer: Self-pay

## 2024-04-05 ENCOUNTER — Other Ambulatory Visit (HOSPITAL_BASED_OUTPATIENT_CLINIC_OR_DEPARTMENT_OTHER): Payer: Self-pay

## 2024-04-10 ENCOUNTER — Other Ambulatory Visit (HOSPITAL_BASED_OUTPATIENT_CLINIC_OR_DEPARTMENT_OTHER): Payer: Self-pay

## 2024-05-18 NOTE — Progress Notes (Unsigned)
 [facility] CONSULT NOTE  Patient Care Team: Signa Rush, MD (Inactive) as PCP - General (Internal Medicine)  ASSESSMENT & PLAN:  Collin Harris is a 59 y.o.male with history of HTN, HLD, CKD3a [facility] low WBC count.  Assessment & Plan   No orders of the defined types were placed in this encounter.   I personally spent a total of *** minutes in the care of the patient today including {Time Based Coding:[id number]}.   All questions were answered. The patient knows to call the clinic with any problems, questions or concerns. No barriers to learning was detected.  Collin JAYSON Chihuahua, MD 2/5/202611:06 PM  CHIEF COMPLAINTS/PURPOSE OF CONSULTATION:  ***  HISTORY OF PRESENTING ILLNESS:  Collin Harris Mon 59 y.o. male is here because of *** I have reviewed his chart and materials related to his cancer extensively and collaborated history with the patient. Summary of oncologic history is as follows: Oncology History   No problem history exists.    MEDICAL HISTORY:  Past Medical History:  Diagnosis Date   Anxiety    Seasonal allergies     SURGICAL HISTORY: Past Surgical History:  Procedure Laterality Date   INGUINAL HERNIA REPAIR Right 2001   SKIN CANCER EXCISION  2008   Mohs basal cell carcinoma neck   TONSILLECTOMY  1968    SOCIAL HISTORY: Social History   Socioeconomic History   Marital status: Divorced    Spouse name: Not on file   Number of children: 0   Years of education: Doctorate    Highest education level: Not on file  Occupational History   Occupation: [employer]   Tobacco Use   Smoking status: Never   Smokeless tobacco: Never  Substance and Sexual Activity   Alcohol use: Yes    Alcohol/week: 2.0 standard drinks of alcohol    Types: 2 Standard drinks or equivalent per week    Comment: 2 beers per week   Drug use: No   Sexual activity: Yes    Partners: Female  Other Topics Concern   Not on file  Social History  Narrative   Right handed   Coffee (1/2 cup per day)   Social Drivers of Health   Tobacco Use: Low Risk  (09/16/2023)   Received from [facility]   Patient History    Smoking Tobacco Use: Never    Smokeless Tobacco Use: Never    Passive Exposure: Not on file  Financial Resource Strain: Not on file  Food Insecurity: No Food Insecurity (05/15/2024)   Epic    Worried About Running Out of Food in the Last Year: Never true    Ran Out of Food in the Last Year: Never true  Transportation Needs: No Transportation Needs (05/15/2024)   Epic    Lack of Transportation (Medical): No    Lack of Transportation (Non-Medical): No  Physical Activity: Not on file  Stress: Not on file  Social Connections: Not on file  Intimate Partner Violence: Not on file  Depression (EYV7-0): Not on file  Alcohol Screen: Not on file  Housing: Unknown (05/15/2024)   Epic    Unable to Pay for Housing in the Last Year: No    Number of Times Moved in the Last Year: Not on file    Homeless in the Last Year: No  Utilities: Not At Risk (05/15/2024)   Epic    Threatened with loss of utilities: No  Health Literacy: Not on file  FAMILY HISTORY: No family history on file.  ALLERGIES:  is allergic to codeine.  MEDICATIONS:  Current Outpatient Medications  Medication Sig Dispense Refill   busPIRone  (BUSPAR ) 15 MG tablet Take 1 tablet (15 mg total) by mouth 2 (two) times daily. 60 tablet 5   cholecalciferol (VITAMIN D) 1000 UNITS tablet Take 1,000 Units by mouth daily.     ezetimibe  (ZETIA ) 10 MG tablet Take 1 tablet (10 mg total) by mouth daily. 30 tablet 2   fluvastatin  XL (LESCOL  XL) 80 MG 24 hr tablet Take 1 tablet (80 mg total) by mouth daily. 90 tablet 3   lisinopril (ZESTRIL) 2.5 MG tablet Take 2.5 mg by mouth daily.     nirmatrelvir /ritonavir  (PAXLOVID , 300/100,) 20 x 150 MG & 10 x 100MG  TBPK Take as directed on packaging instructions 30 each 0   nirmatrelvir /ritonavir , renal dosing, (PAXLOVID ,  150/100,) 10 x 150 MG & 10 x 100MG  TBPK Take as directed per packagaing instructions 20 each 0   Omega-3 Fatty Acids (FISH OIL PO) Take 1,000 mg by mouth daily.     ondansetron  (ZOFRAN -ODT) 8 MG disintegrating tablet Take 1 tablet (8 mg total) by mouth 3 (three) times daily as needed. Place on tongue and allow to dissolve. 9 tablet 0   telmisartan  (MICARDIS ) 20 MG tablet Take 1 tablet (20 mg total) by mouth daily. 90 tablet 3   No current facility-administered medications for this visit.    REVIEW OF SYSTEMS:   All relevant systems were reviewed with the patient and are negative.  PHYSICAL EXAMINATION: ECOG PERFORMANCE STATUS: {CHL ONC ECOG PS:[id number]}  There were no vitals filed for this visit. There were no vitals filed for this visit.  GENERAL: alert, no distress and comfortable SKIN: skin color is normal, no jaundice, rashes or significant lesions EYES: sclera clear OROPHARYNX: no exudate, no erythema NECK: supple LYMPH:  no palpable lymphadenopathy in the cervical, axillary regions LUNGS: Effort normal, no respiratory distress.  Clear to auscultation bilaterally HEART: regular rate & rhythm and no lower extremity edema ABDOMEN: soft, non-tender and nondistended Musculoskeletal: no edema NEURO: no focal motor/sensory deficits  LABORATORY DATA:  I have reviewed the data as listed Lab Results  Component Value Date   WBC 5.0 09/04/2008   HGB 14.0 09/04/2008   HCT 39.6 09/04/2008   MCV 89.3 09/04/2008   PLT 165.0 09/04/2008   No results for input(s): NA, K, CL, CO2, GLUCOSE, BUN, CREATININE, CALCIUM, GFRNONAA, GFRAA, PROT, ALBUMIN, AST, ALT, ALKPHOS, BILITOT, BILIDIR, IBILI in the last 8760 hours.  RADIOGRAPHIC STUDIES: I have personally reviewed the radiological images as listed and agreed with the findings in the report. No results found.

## 2024-05-19 ENCOUNTER — Inpatient Hospital Stay: Payer: Self-pay

## 2024-05-19 VITALS — BP 123/58 | HR 58 | Temp 97.7°F | Resp 17 | Ht 65.0 in | Wt 135.0 lb

## 2024-05-19 DIAGNOSIS — D72819 Decreased white blood cell count, unspecified: Secondary | ICD-10-CM
